# Patient Record
Sex: Male | Born: 1960 | Race: Asian | Hispanic: No | Marital: Married | Smoking: Former smoker
Health system: Southern US, Community
[De-identification: ages and names within clinical notes are randomized; demographics above are authoritative.]

## PROBLEM LIST (undated history)

## (undated) DIAGNOSIS — F329 Major depressive disorder, single episode, unspecified: Secondary | ICD-10-CM

## (undated) DIAGNOSIS — M1990 Unspecified osteoarthritis, unspecified site: Secondary | ICD-10-CM

## (undated) DIAGNOSIS — F32A Depression, unspecified: Secondary | ICD-10-CM

## (undated) DIAGNOSIS — E119 Type 2 diabetes mellitus without complications: Secondary | ICD-10-CM

## (undated) DIAGNOSIS — I10 Essential (primary) hypertension: Secondary | ICD-10-CM

## (undated) HISTORY — PX: ROTATOR CUFF REPAIR: SHX139

## (undated) HISTORY — PX: APPENDECTOMY: SHX54

## (undated) HISTORY — PX: KNEE SURGERY: SHX244

## (undated) HISTORY — PX: SHOULDER SURGERY: SHX246

---

## 2012-06-23 ENCOUNTER — Emergency Department (HOSPITAL_COMMUNITY)
Admission: EM | Admit: 2012-06-23 | Discharge: 2012-06-23 | Disposition: A | Discharge status: Home / Self Care | Payer: Self-pay | Attending: Emergency Medicine | Admitting: Emergency Medicine

## 2012-06-23 ENCOUNTER — Encounter (HOSPITAL_COMMUNITY): Payer: Self-pay | Admitting: Emergency Medicine

## 2012-06-23 DIAGNOSIS — I10 Essential (primary) hypertension: Secondary | ICD-10-CM | POA: Insufficient documentation

## 2012-06-23 DIAGNOSIS — E119 Type 2 diabetes mellitus without complications: Secondary | ICD-10-CM | POA: Insufficient documentation

## 2012-06-23 DIAGNOSIS — M109 Gout, unspecified: Secondary | ICD-10-CM | POA: Insufficient documentation

## 2012-06-23 DIAGNOSIS — Z8659 Personal history of other mental and behavioral disorders: Secondary | ICD-10-CM | POA: Insufficient documentation

## 2012-06-23 DIAGNOSIS — M25473 Effusion, unspecified ankle: Secondary | ICD-10-CM | POA: Insufficient documentation

## 2012-06-23 DIAGNOSIS — Z87891 Personal history of nicotine dependence: Secondary | ICD-10-CM | POA: Insufficient documentation

## 2012-06-23 DIAGNOSIS — M25476 Effusion, unspecified foot: Secondary | ICD-10-CM | POA: Insufficient documentation

## 2012-06-23 HISTORY — DX: Unspecified osteoarthritis, unspecified site: M19.90

## 2012-06-23 HISTORY — DX: Major depressive disorder, single episode, unspecified: F32.9

## 2012-06-23 HISTORY — DX: Type 2 diabetes mellitus without complications: E11.9

## 2012-06-23 HISTORY — DX: Essential (primary) hypertension: I10

## 2012-06-23 HISTORY — DX: Depression, unspecified: F32.A

## 2012-06-23 MED ORDER — PREDNISONE 20 MG PO TABS: 40.0000 mg | ORAL_TABLET | Freq: Every day | ORAL | Status: DC

## 2012-06-23 MED ORDER — COLCHICINE 0.6 MG PO TABS: 0.6000 mg | ORAL_TABLET | Freq: Two times a day (BID) | ORAL | Status: DC

## 2012-06-23 MED ORDER — HYDROCODONE-ACETAMINOPHEN 5-325 MG PO TABS: 1.0000 | ORAL_TABLET | Freq: Four times a day (QID) | ORAL | Status: DC | PRN

## 2012-06-23 MED ORDER — HYDROCODONE-ACETAMINOPHEN 5-325 MG PO TABS
2.0000 | ORAL_TABLET | Freq: Once | ORAL | Status: AC
  Administered 2012-06-23: 2 via ORAL
  Filled 2012-06-23: qty 2

## 2012-06-23 MED ORDER — COLCHICINE 0.6 MG PO TABS
1.2000 mg | ORAL_TABLET | Freq: Once | ORAL | Status: AC
  Administered 2012-06-23: 1.2 mg via ORAL
  Filled 2012-06-23: qty 2

## 2012-06-23 MED ORDER — PREDNISONE 20 MG PO TABS
60.0000 mg | ORAL_TABLET | Freq: Once | ORAL | Status: AC
  Administered 2012-06-23: 60 mg via ORAL
  Filled 2012-06-23: qty 3

## 2012-06-23 MED ORDER — PREDNISONE 20 MG PO TABS: 60.0000 mg | ORAL_TABLET | Freq: Once | ORAL | Status: DC

## 2012-06-23 NOTE — ED Provider Notes (Signed)
History     CSN: [id number]  Arrival date & time 06/23/12  0032   First MD Initiated Contact with Patient 06/23/12 0122      Chief Complaint  Patient presents with  . Foot Pain    (Consider location/radiation/quality/duration/timing/severity/associated sxs/prior treatment) HPI Comments: Patient is a 52 year old male with a history of gout who presents for left foot pain x2 days. Patient states the pain has been gradually worsening since onset, is nonradiating, constant, and sharp in quality. Patient states the pain is worse with weightbearing and with palpation; "it hurts to even lay sheet on my foot". Patient states that pain is consistent with gouty attacks he has had in the past. Patient currently takes 300 mg of allopurinol daily and has tried indomethacin relief. Patient denies fevers, redness or pallor, numbness or tingling, and weakness.  Patient is a 52 y.o. male presenting with lower extremity pain. The history is provided by the patient. No language interpreter was used.  Foot Pain Associated symptoms include arthralgias and joint swelling. Pertinent negatives include no fever.    Past Medical History  Diagnosis Date  . Arthritis     gout  . Diabetes mellitus without complication   . Hypertension   . Depression     Past Surgical History  Procedure Laterality Date  . Rotator cuff repair    . Shoulder surgery    . Appendectomy    . Knee surgery      No family history on file.  History  Substance Use Topics  . Smoking status: Former Games developer  . Smokeless tobacco: Not on file  . Alcohol Use: No     Review of Systems  Constitutional: Negative for fever.  Musculoskeletal: Positive for joint swelling and arthralgias.  Skin: Negative for color change and pallor.  All other systems reviewed and are negative.    Allergies  Review of patient's allergies indicates no known allergies.  Home Medications   Current Outpatient Rx  Name  Route  Sig  Dispense   Refill  . colchicine 0.6 MG tablet   Oral   Take 1 tablet (0.6 mg total) by mouth 2 (two) times daily. For acute gouty attack   8 tablet   0   . HYDROcodone-acetaminophen (NORCO/VICODIN) 5-325 MG per tablet   Oral   Take 1-2 tablets by mouth every 6 (six) hours as needed for pain.   15 tablet   0   . predniSONE (DELTASONE) 20 MG tablet   Oral   Take 2 tablets (40 mg total) by mouth daily.   10 tablet   0     BP 120/77  Pulse 82  Temp(Src) 97.6 F (36.4 C) (Oral)  Resp 17  Ht 5\' 11"  (1.803 m)  Wt 180 lb (81.647 kg)  BMI 25.12 kg/m2  SpO2 96%  Physical Exam  Nursing note and vitals reviewed. Constitutional: He is oriented to person, place, and time. He appears well-developed and well-nourished. No distress.  HENT:  Head: Normocephalic and atraumatic.  Eyes: Conjunctivae and EOM are normal. No scleral icterus.  Neck: Normal range of motion.  Cardiovascular: Normal rate, regular rhythm and normal heart sounds.   Dorsalis pedis and posterior tibial pulses 2+ bilaterally  Pulmonary/Chest: Effort normal and breath sounds normal. No respiratory distress. He has no wheezes. He has no rales.  Musculoskeletal:  Mild swelling and TTP of R ankle and dorsal surface of R foot as well as lateral aspect of 5th MTP joint. Areas of tenderness with slight  heat-to-touch. No decreased ROM, rashes, crepitus, or palpable deformities appreciated.  Neurological: He is alert and oriented to person, place, and time.  Skin: Skin is warm and dry. No rash noted. He is not diaphoretic. No erythema. No pallor.  Psychiatric: He has a normal mood and affect. His behavior is normal.    ED Course  Procedures (including critical care time)  Labs Reviewed - No data to display No results found.   1. Gout flare     MDM  Patient presents for left foot pain x2 days. Symptoms consistent with gout flare and patient endorses that symptoms the same as past gouty attacks. Patient is neurovascularly  intact; there is tenderness to palpation of the left ankle joint as well as the dorsal surface of the left foot and 5th MTP joint with mild swelling and warmth to touch. Symptoms treated with colchicine and Norco in ED; patient also requesting prednisone. Patient hemodynamically stable, well and nontoxic appearing; appropriate for discharge with primary care followup. Indications for ED return provided. Patient states comfort and understanding with this discharge plan with no unaddressed concerns.   Filed Vitals:   06/23/12 0044 06/23/12 0048 06/23/12 0213  BP: 121/85  120/77  Pulse: 92  82  Temp: 97.6 F (36.4 C)    TempSrc: Oral    Resp: 20  17  Height:  5\' 11"  (1.803 m)   Weight:  180 lb (81.647 kg)   SpO2: 95%  96%       Antony Madura, PA-C 06/23/12 0515

## 2012-06-23 NOTE — ED Provider Notes (Signed)
Medical screening examination/treatment/procedure(s) were performed by non-physician practitioner and as supervising physician I was immediately available for consultation/collaboration.  Jones Skene, M.D.     Jones Skene, MD 06/23/12 [phone]

## 2012-06-23 NOTE — ED Notes (Signed)
Pt c/o L foot pain onset yesterday, pt states he feels this is a gout flare. Slight swelling noted to L lateral foot

## 2013-10-29 ENCOUNTER — Ambulatory Visit (INDEPENDENT_AMBULATORY_CARE_PROVIDER_SITE_OTHER): Payer: 59 | Admitting: Family Medicine

## 2013-10-29 VITALS — BP 115/80 | HR 84 | Temp 97.7°F | Resp 16 | Ht 69.75 in | Wt 173.6 lb

## 2013-10-29 DIAGNOSIS — F329 Major depressive disorder, single episode, unspecified: Secondary | ICD-10-CM

## 2013-10-29 DIAGNOSIS — F3289 Other specified depressive episodes: Secondary | ICD-10-CM

## 2013-10-29 DIAGNOSIS — F411 Generalized anxiety disorder: Secondary | ICD-10-CM

## 2013-10-29 DIAGNOSIS — M109 Gout, unspecified: Secondary | ICD-10-CM

## 2013-10-29 DIAGNOSIS — E119 Type 2 diabetes mellitus without complications: Secondary | ICD-10-CM

## 2013-10-29 DIAGNOSIS — F32A Depression, unspecified: Secondary | ICD-10-CM

## 2013-10-29 DIAGNOSIS — G4700 Insomnia, unspecified: Secondary | ICD-10-CM

## 2013-10-29 LAB — POCT URINALYSIS DIPSTICK
Bilirubin, UA: NEGATIVE
Blood, UA: NEGATIVE
Glucose, UA: NEGATIVE
Ketones, UA: NEGATIVE
Leukocytes, UA: NEGATIVE
Nitrite, UA: NEGATIVE
Protein, UA: NEGATIVE
Spec Grav, UA: 1.02
Urobilinogen, UA: 0.2
pH, UA: 5

## 2013-10-29 LAB — COMPLETE METABOLIC PANEL WITH GFR
Albumin: 4.3 g/dL (ref 3.5–5.2)
Alkaline Phosphatase: 102 U/L (ref 39–117)
BUN: 15 mg/dL (ref 6–23)
GFR, Est Non African American: >89 mL/min
Glucose, Bld: 83 mg/dL (ref 70–99)
Total Bilirubin: 0.4 mg/dL (ref 0.2–1.2)

## 2013-10-29 LAB — COMPLETE METABOLIC PANEL WITHOUT GFR
ALT: 13 U/L (ref 0–53)
AST: 18 U/L (ref 0–37)
CO2: 31 meq/L (ref 19–32)
Calcium: 10.1 mg/dL (ref 8.4–10.5)
Chloride: 99 meq/L (ref 96–112)
Creat: 0.93 mg/dL (ref 0.50–1.35)
GFR, Est African American: >89 mL/min
Potassium: 4.9 meq/L (ref 3.5–5.3)
Sodium: 140 meq/L (ref 135–145)
Total Protein: 8 g/dL (ref 6.0–8.3)

## 2013-10-29 LAB — POCT GLYCOSYLATED HEMOGLOBIN (HGB A1C): Hemoglobin A1C: 6.5

## 2013-10-29 LAB — GLUCOSE, POCT (MANUAL RESULT ENTRY): POC Glucose: 71 mg/dL (ref 70–99)

## 2013-10-29 MED ORDER — ALLOPURINOL 100 MG PO TABS: 100.0000 mg | ORAL_TABLET | Freq: Every day | ORAL | Status: DC

## 2013-10-29 MED ORDER — COLCHICINE 0.6 MG PO TABS: 0.6000 mg | ORAL_TABLET | Freq: Two times a day (BID) | ORAL | Status: DC

## 2013-10-29 MED ORDER — CLONAZEPAM 1 MG PO TABS: 1.0000 mg | ORAL_TABLET | Freq: Three times a day (TID) | ORAL | Status: DC | PRN

## 2013-10-29 MED ORDER — ZOLPIDEM TARTRATE 10 MG PO TABS: 10.0000 mg | ORAL_TABLET | Freq: Every evening | ORAL | Status: DC | PRN

## 2013-10-29 MED ORDER — GLIPIZIDE ER 5 MG PO TB24: 5.0000 mg | ORAL_TABLET | Freq: Every day | ORAL | Status: DC

## 2013-10-29 MED ORDER — SERTRALINE HCL 100 MG PO TABS: 100.0000 mg | ORAL_TABLET | Freq: Every day | ORAL | Status: DC

## 2013-10-29 NOTE — Patient Instructions (Signed)
Diabetes and Standards of Medical Care Diabetes is complicated. You may find that your diabetes team includes a dietitian, nurse, diabetes educator, eye doctor, and more. To help everyone know what is going on and to help you get the care you deserve, the following schedule of care was developed to help keep you on track. Below are the tests, exams, vaccines, medicines, education, and plans you will need. HbA1c test This test shows how well you have controlled your glucose over the past 2-3 months. It is used to see if your diabetes management plan needs to be adjusted.   It is performed at least 2 times a year if you are meeting treatment goals.  It is performed 4 times a year if therapy has changed or if you are not meeting treatment goals. Blood pressure test  This test is performed at every routine medical visit. The goal is less than 140/90 mm Hg for most people, but 130/80 mm Hg in some cases. Ask your health care provider about your goal. Dental exam  Follow up with the dentist regularly. Eye exam  If you are diagnosed with type 1 diabetes as a child, get an exam upon reaching the age of 37 years or older and have had diabetes for 3-5 years. Yearly eye exams are recommended after that initial eye exam.  If you are diagnosed with type 1 diabetes as an adult, get an exam within 5 years of diagnosis and then yearly.  If you are diagnosed with type 2 diabetes, get an exam as soon as possible after the diagnosis and then yearly. Foot care exam  Visual foot exams are performed at every routine medical visit. The exams check for cuts, injuries, or other problems with the feet.  A comprehensive foot exam should be done yearly. This includes visual inspection as well as assessing foot pulses and testing for loss of sensation.  Check your feet nightly for cuts, injuries, or other problems with your feet. Tell your health care provider if anything is not healing. Kidney function test (urine  microalbumin)  This test is performed once a year.  Type 1 diabetes: The first test is performed 5 years after diagnosis.  Type 2 diabetes: The first test is performed at the time of diagnosis.  A serum creatinine and estimated glomerular filtration rate (eGFR) test is done once a year to assess the level of chronic kidney disease (CKD), if present. Lipid profile (cholesterol, HDL, LDL, triglycerides)  Performed every 5 years for most people.  The goal for LDL is less than 100 mg/dL. If you are at high risk, the goal is less than 70 mg/dL.  The goal for HDL is 40 mg/dL-50 mg/dL for men and 50 mg/dL-60 mg/dL for women. An HDL cholesterol of 60 mg/dL or higher gives some protection against heart disease.  The goal for triglycerides is less than 150 mg/dL. Influenza vaccine, pneumococcal vaccine, and hepatitis B vaccine  The influenza vaccine is recommended yearly.  It is recommended that people with diabetes who are over 24 years old get the pneumonia vaccine. In some cases, two separate shots may be given. Ask your health care provider if your pneumonia vaccination is up to date.  The hepatitis B vaccine is also recommended for adults with diabetes. Diabetes self-management education  Education is recommended at diagnosis and ongoing as needed. Treatment plan  Your treatment plan is reviewed at every medical visit. Document Released: 12/05/2008 Document Revised: 06/24/2013 Document Reviewed: 07/10/2012 [facility] Patient Information 2015 [place],  [employer]. This information is not intended to replace advice given to you by your health care provider. Make sure you discuss any questions you have with your health care provider.  
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
## 2013-10-30 ENCOUNTER — Encounter: Payer: Self-pay | Admitting: Family Medicine

## 2013-11-24 ENCOUNTER — Emergency Department (HOSPITAL_COMMUNITY): Payer: Self-pay

## 2013-11-24 ENCOUNTER — Encounter (HOSPITAL_COMMUNITY): Payer: Self-pay | Admitting: Emergency Medicine

## 2013-11-24 ENCOUNTER — Emergency Department (HOSPITAL_COMMUNITY)
Admission: EM | Admit: 2013-11-24 | Discharge: 2013-11-24 | Disposition: A | Discharge status: Home / Self Care | Payer: Self-pay | Attending: Emergency Medicine | Admitting: Emergency Medicine

## 2013-11-24 DIAGNOSIS — Y9389 Activity, other specified: Secondary | ICD-10-CM | POA: Insufficient documentation

## 2013-11-24 DIAGNOSIS — M1000 Idiopathic gout, unspecified site: Secondary | ICD-10-CM | POA: Insufficient documentation

## 2013-11-24 DIAGNOSIS — E119 Type 2 diabetes mellitus without complications: Secondary | ICD-10-CM | POA: Insufficient documentation

## 2013-11-24 DIAGNOSIS — Z79899 Other long term (current) drug therapy: Secondary | ICD-10-CM | POA: Insufficient documentation

## 2013-11-24 DIAGNOSIS — S5012XA Contusion of left forearm, initial encounter: Secondary | ICD-10-CM | POA: Insufficient documentation

## 2013-11-24 DIAGNOSIS — S80212A Abrasion, left knee, initial encounter: Secondary | ICD-10-CM | POA: Insufficient documentation

## 2013-11-24 DIAGNOSIS — S50812A Abrasion of left forearm, initial encounter: Secondary | ICD-10-CM | POA: Insufficient documentation

## 2013-11-24 DIAGNOSIS — Z87891 Personal history of nicotine dependence: Secondary | ICD-10-CM | POA: Insufficient documentation

## 2013-11-24 DIAGNOSIS — F329 Major depressive disorder, single episode, unspecified: Secondary | ICD-10-CM | POA: Insufficient documentation

## 2013-11-24 DIAGNOSIS — I10 Essential (primary) hypertension: Secondary | ICD-10-CM | POA: Insufficient documentation

## 2013-11-24 DIAGNOSIS — Y92410 Unspecified street and highway as the place of occurrence of the external cause: Secondary | ICD-10-CM | POA: Insufficient documentation

## 2013-11-24 MED ORDER — NAPROXEN 500 MG PO TABS: 500.0000 mg | ORAL_TABLET | Freq: Two times a day (BID) | ORAL | Status: DC

## 2013-11-24 MED ORDER — TRAMADOL HCL 50 MG PO TABS: 50.0000 mg | ORAL_TABLET | Freq: Four times a day (QID) | ORAL | Status: DC | PRN

## 2013-11-24 MED ORDER — ONDANSETRON 4 MG PO TBDP
4.0000 mg | ORAL_TABLET | Freq: Once | ORAL | Status: AC
  Administered 2013-11-24: 4 mg via ORAL
  Filled 2013-11-24: qty 1

## 2013-11-24 MED ORDER — OXYCODONE-ACETAMINOPHEN 5-325 MG PO TABS
2.0000 | ORAL_TABLET | Freq: Once | ORAL | Status: AC
  Administered 2013-11-24: 2 via ORAL
  Filled 2013-11-24: qty 2

## 2013-11-24 NOTE — ED Notes (Signed)
c-collar removed per MD request
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
## 2013-11-24 NOTE — ED Notes (Signed)
CBG 100 

## 2013-11-27 NOTE — ED Provider Notes (Signed)
Medical screening examination/treatment/procedure(s) were performed by non-physician practitioner and as supervising physician I was immediately available for consultation/collaboration.   Warnell Foresterrey Havana Baldwin, MD 11/27/13 [phone]

## 2014-09-14 ENCOUNTER — Emergency Department: Payer: Self-pay

## 2014-09-14 ENCOUNTER — Encounter: Payer: Self-pay | Admitting: *Deleted

## 2014-09-14 ENCOUNTER — Other Ambulatory Visit: Payer: Self-pay

## 2014-09-14 ENCOUNTER — Inpatient Hospital Stay
Admission: EM | Admit: 2014-09-14 | Discharge: 2014-09-15 | DRG: 916 | Disposition: A | Discharge status: Home / Self Care | Payer: Self-pay | Attending: Specialist | Admitting: Specialist

## 2014-09-14 DIAGNOSIS — Z9049 Acquired absence of other specified parts of digestive tract: Secondary | ICD-10-CM | POA: Diagnosis present

## 2014-09-14 DIAGNOSIS — F1121 Opioid dependence, in remission: Secondary | ICD-10-CM | POA: Diagnosis present

## 2014-09-14 DIAGNOSIS — F329 Major depressive disorder, single episode, unspecified: Secondary | ICD-10-CM | POA: Diagnosis present

## 2014-09-14 DIAGNOSIS — E876 Hypokalemia: Secondary | ICD-10-CM | POA: Diagnosis present

## 2014-09-14 DIAGNOSIS — M109 Gout, unspecified: Secondary | ICD-10-CM | POA: Diagnosis present

## 2014-09-14 DIAGNOSIS — E119 Type 2 diabetes mellitus without complications: Secondary | ICD-10-CM | POA: Diagnosis present

## 2014-09-14 DIAGNOSIS — Z809 Family history of malignant neoplasm, unspecified: Secondary | ICD-10-CM

## 2014-09-14 DIAGNOSIS — T783XXA Angioneurotic edema, initial encounter: Secondary | ICD-10-CM | POA: Diagnosis present

## 2014-09-14 DIAGNOSIS — I10 Essential (primary) hypertension: Secondary | ICD-10-CM | POA: Diagnosis present

## 2014-09-14 DIAGNOSIS — Z833 Family history of diabetes mellitus: Secondary | ICD-10-CM

## 2014-09-14 DIAGNOSIS — T782XXA Anaphylactic shock, unspecified, initial encounter: Principal | ICD-10-CM | POA: Diagnosis present

## 2014-09-14 DIAGNOSIS — Z9889 Other specified postprocedural states: Secondary | ICD-10-CM

## 2014-09-14 DIAGNOSIS — M1990 Unspecified osteoarthritis, unspecified site: Secondary | ICD-10-CM | POA: Diagnosis present

## 2014-09-14 DIAGNOSIS — Z79899 Other long term (current) drug therapy: Secondary | ICD-10-CM

## 2014-09-14 DIAGNOSIS — Z87891 Personal history of nicotine dependence: Secondary | ICD-10-CM

## 2014-09-14 LAB — TROPONIN I

## 2014-09-14 LAB — CBC WITH DIFFERENTIAL/PLATELET
BASOS ABS: 0 10*3/uL (ref 0–0.1)
Basophils Relative: 0 %
Eosinophils Absolute: 0 10*3/uL (ref 0–0.7)
Eosinophils Relative: 0 %
HCT: 38.7 % — ABNORMAL LOW (ref 40.0–52.0)
Hemoglobin: 12.6 g/dL — ABNORMAL LOW (ref 13.0–18.0)
LYMPHS PCT: 3 %
Lymphs Abs: 0.5 10*3/uL — ABNORMAL LOW (ref 1.0–3.6)
MCH: 29.8 pg (ref 26.0–34.0)
MCHC: 32.5 g/dL (ref 32.0–36.0)
MCV: 91.7 fL (ref 80.0–100.0)
MONO ABS: 0.2 10*3/uL (ref 0.2–1.0)
Monocytes Relative: 1 %
Neutro Abs: 13.1 10*3/uL — ABNORMAL HIGH (ref 1.4–6.5)
Neutrophils Relative %: 96 %
Platelets: 269 10*3/uL (ref 150–440)
RBC: 4.22 MIL/uL — ABNORMAL LOW (ref 4.40–5.90)
RDW: 13.3 % (ref 11.5–14.5)
WBC: 13.7 10*3/uL — AB (ref 3.8–10.6)

## 2014-09-14 LAB — GLUCOSE, CAPILLARY: GLUCOSE-CAPILLARY: 225 mg/dL — AB (ref 65–99)

## 2014-09-14 LAB — BASIC METABOLIC PANEL
Anion gap: 5 (ref 5–15)
BUN: 22 mg/dL — AB (ref 6–20)
CHLORIDE: 115 mmol/L — AB (ref 101–111)
CO2: 18 mmol/L — ABNORMAL LOW (ref 22–32)
CREATININE: 1.06 mg/dL (ref 0.61–1.24)
Calcium: 7.8 mg/dL — ABNORMAL LOW (ref 8.9–10.3)
GFR calc Af Amer: >60 mL/min (ref >60)
Glucose, Bld: 331 mg/dL — ABNORMAL HIGH (ref 65–99)
Potassium: 3 mmol/L — ABNORMAL LOW (ref 3.5–5.1)
Sodium: 138 mmol/L (ref 135–145)

## 2014-09-14 LAB — MRSA PCR SCREENING: MRSA by PCR: NEGATIVE

## 2014-09-14 LAB — MAGNESIUM: Magnesium: 1.8 mg/dL (ref 1.7–2.4)

## 2014-09-14 MED ORDER — EPINEPHRINE HCL 0.1 MG/ML IJ SOSY
PREFILLED_SYRINGE | INTRAMUSCULAR | Status: AC
Start: 2014-09-14 — End: 2014-09-14
  Filled 2014-09-14: qty 10

## 2014-09-14 MED ORDER — DIPHENHYDRAMINE HCL 50 MG/ML IJ SOLN
50.0000 mg | Freq: Once | INTRAMUSCULAR | Status: AC
  Administered 2014-09-14: 50 mg via INTRAVENOUS

## 2014-09-14 MED ORDER — ZOLPIDEM TARTRATE 5 MG PO TABS: 10.0000 mg | ORAL_TABLET | Freq: Every evening | ORAL | Status: DC | PRN

## 2014-09-14 MED ORDER — EPINEPHRINE 0.3 MG/0.3ML IJ SOAJ: 0.3000 mg | Freq: Once | INTRAMUSCULAR | Status: DC

## 2014-09-14 MED ORDER — METHYLPREDNISOLONE SODIUM SUCC 125 MG IJ SOLR
125.0000 mg | Freq: Once | INTRAMUSCULAR | Status: AC
  Administered 2014-09-14: 125 mg via INTRAVENOUS

## 2014-09-14 MED ORDER — SODIUM CHLORIDE 0.9 % IV SOLN
Freq: Once | INTRAVENOUS | Status: AC
  Administered 2014-09-14: 07:00:00 via INTRAVENOUS

## 2014-09-14 MED ORDER — COLCHICINE 0.6 MG PO TABS: 0.6000 mg | ORAL_TABLET | Freq: Two times a day (BID) | ORAL | Status: DC | PRN

## 2014-09-14 MED ORDER — EPINEPHRINE HCL 1 MG/ML IJ SOLN
0.3000 mg | Freq: Once | INTRAMUSCULAR | Status: AC
  Administered 2014-09-14: 0.3 mg via INTRAMUSCULAR

## 2014-09-14 MED ORDER — ONDANSETRON HCL 4 MG/2ML IJ SOLN: 4.0000 mg | Freq: Four times a day (QID) | INTRAMUSCULAR | Status: DC | PRN

## 2014-09-14 MED ORDER — INSULIN ASPART 100 UNIT/ML ~~LOC~~ SOLN
0.0000 [IU] | Freq: Every day | SUBCUTANEOUS | Status: DC
  Administered 2014-09-14: 2 [IU] via SUBCUTANEOUS
  Filled 2014-09-14: qty 5

## 2014-09-14 MED ORDER — SODIUM CHLORIDE 0.9 % IV SOLN
Freq: Once | INTRAVENOUS | Status: AC
  Administered 2014-09-14: 06:00:00 via INTRAVENOUS

## 2014-09-14 MED ORDER — ACETAMINOPHEN 325 MG PO TABS: 650.0000 mg | ORAL_TABLET | Freq: Four times a day (QID) | ORAL | Status: DC | PRN

## 2014-09-14 MED ORDER — CLONAZEPAM 1 MG PO TABS: 1.0000 mg | ORAL_TABLET | Freq: Three times a day (TID) | ORAL | Status: DC | PRN

## 2014-09-14 MED ORDER — BUPRENORPHINE HCL 2 MG SL SUBL
2.0000 mg | SUBLINGUAL_TABLET | Freq: Two times a day (BID) | SUBLINGUAL | Status: DC
  Filled 2014-09-14: qty 1

## 2014-09-14 MED ORDER — GLIPIZIDE ER 5 MG PO TB24
5.0000 mg | ORAL_TABLET | Freq: Every day | ORAL | Status: DC
  Administered 2014-09-15: 5 mg via ORAL
  Filled 2014-09-14: qty 1

## 2014-09-14 MED ORDER — SERTRALINE HCL 50 MG PO TABS
100.0000 mg | ORAL_TABLET | Freq: Every day | ORAL | Status: DC
  Administered 2014-09-14 – 2014-09-15 (×2): 100 mg via ORAL
  Filled 2014-09-14 (×2): qty 2

## 2014-09-14 MED ORDER — EPINEPHRINE HCL 1 MG/ML IJ SOLN
INTRAMUSCULAR | Status: AC
  Administered 2014-09-14: 0.3 mg via INTRAMUSCULAR
  Filled 2014-09-14: qty 1

## 2014-09-14 MED ORDER — POTASSIUM CHLORIDE CRYS ER 20 MEQ PO TBCR
20.0000 meq | EXTENDED_RELEASE_TABLET | Freq: Two times a day (BID) | ORAL | Status: DC
  Administered 2014-09-14 – 2014-09-15 (×3): 20 meq via ORAL
  Filled 2014-09-14 (×3): qty 1

## 2014-09-14 MED ORDER — TRAMADOL HCL 50 MG PO TABS
50.0000 mg | ORAL_TABLET | Freq: Four times a day (QID) | ORAL | Status: DC | PRN
Start: 2014-09-14 — End: 2014-09-15

## 2014-09-14 MED ORDER — METHYLPREDNISOLONE SODIUM SUCC 40 MG IJ SOLR
40.0000 mg | Freq: Four times a day (QID) | INTRAMUSCULAR | Status: DC
  Administered 2014-09-14 – 2014-09-15 (×5): 40 mg via INTRAVENOUS
  Filled 2014-09-14 (×5): qty 1

## 2014-09-14 MED ORDER — FAMOTIDINE IN NACL 20-0.9 MG/50ML-% IV SOLN
20.0000 mg | Freq: Two times a day (BID) | INTRAVENOUS | Status: DC
  Administered 2014-09-14 – 2014-09-15 (×3): 20 mg via INTRAVENOUS
  Filled 2014-09-14 (×5): qty 50

## 2014-09-14 MED ORDER — ACETAMINOPHEN 650 MG RE SUPP: 650.0000 mg | Freq: Four times a day (QID) | RECTAL | Status: DC | PRN

## 2014-09-14 MED ORDER — BUTALBITAL-APAP-CAFFEINE 50-325-40 MG PO TABS: 1.0000 | ORAL_TABLET | Freq: Four times a day (QID) | ORAL | Status: DC | PRN

## 2014-09-14 MED ORDER — DIPHENHYDRAMINE HCL 50 MG/ML IJ SOLN
25.0000 mg | Freq: Four times a day (QID) | INTRAMUSCULAR | Status: DC | PRN
  Filled 2014-09-14: qty 1

## 2014-09-14 MED ORDER — INSULIN ASPART 100 UNIT/ML ~~LOC~~ SOLN
0.0000 [IU] | Freq: Three times a day (TID) | SUBCUTANEOUS | Status: DC
  Administered 2014-09-15: 5 [IU] via SUBCUTANEOUS
  Administered 2014-09-15: 3 [IU] via SUBCUTANEOUS
  Filled 2014-09-14: qty 3
  Filled 2014-09-14: qty 5

## 2014-09-14 MED ORDER — ONDANSETRON HCL 4 MG PO TABS: 4.0000 mg | ORAL_TABLET | Freq: Four times a day (QID) | ORAL | Status: DC | PRN

## 2014-09-14 MED ORDER — DIPHENHYDRAMINE HCL 50 MG/ML IJ SOLN
25.0000 mg | Freq: Once | INTRAMUSCULAR | Status: DC
  Filled 2014-09-14: qty 1

## 2014-09-14 MED ORDER — PREDNISONE 20 MG PO TABS: 40.0000 mg | ORAL_TABLET | Freq: Every day | ORAL | Status: DC

## 2014-09-14 MED ORDER — ALLOPURINOL 100 MG PO TABS: 100.0000 mg | ORAL_TABLET | Freq: Every day | ORAL | Status: DC | PRN

## 2014-09-14 NOTE — ED Notes (Signed)
Pt sleeping. Easily awakened. Dr. Elenore Rota remains at bedside. Pt's tongue continues to decrease in size. Speech more clear. Rash continues to improve.

## 2014-09-14 NOTE — Consult Note (Signed)
Dennis Pineda, Dennis Pineda [id number] November 16, 1960 No att. providers found  Reason for Consult: Acute anaphylaxis  HPI: The patient is a 54 year old white male who had a normal day yesterday he went to bed about 10:00 after having had an [employer] meal at around 9 PM. He was feeling well when he went to bed. Woke about 4 AM in the morning and felt like he was having some itching on his left forearm or possibly a bite of some sort. It had some poison ivy on that arm over the last couple weeks it has been treated. He put some poison ivy spray on his arm that is used in the past and went back to bed. Approximately 5 AM he awoke with his legs burning and feeling like he was swelling. His tongue was swelling as well and his wife immediately brought him to the emergency room. It took her 20-25 minutes to get him in. By that time he had significant swelling of his tongue. He was given IV Solu-Medrol, EpiPen 2, IV Benadryl and IV H2 blocker. I was called at that time and saw him by about 6:15 AM at that point his tongue swelling was already down and he was starting to talk normally still felt orbital bit of sensation in his left arm but his whole body rash had subsided. We watched him over 30 minutes and it seemed like his swelling continued to decrease any was doing better and felt that he would likely be able to be discharged home in a couple hours to make sure he is doing well.  I was then called back to the hospital approximately 8 AM where the patient had had increasing swelling of his tongue again. He was given 2 more doses at appendectomy and another 50 mg of IV Benadryl.   by 8:30 when I was seeing him he was starting to feel better again. His tongue swelling at settle down. Does not have any rash. oxygenation was good and he was able to swallow his saliva well. There is concerns of continuing potential anaphylaxis flareups and whether his airway is secure not.  Allergies: No Known Allergies  ROS: Review of systems  normal other than 12 systems except per HPI.  PMH:  Past Medical History  Diagnosis Date  . Arthritis     gout  . Diabetes mellitus without complication   . Hypertension   . Depression     FH:  Family History  Problem Relation Age of Onset  . Diabetes Mother   . Cancer Father     Liver  . Diabetes Father   . Diabetes Sister   . Diabetes Maternal Grandmother     SH:  History   Social History  . Marital Status: Married    Spouse Name: N/A  . Number of Children: N/A  . Years of Education: N/A   Occupational History  . Not on file.   Social History Main Topics  . Smoking status: Former Games developer  . Smokeless tobacco: Not on file  . Alcohol Use: No  . Drug Use: No  . Sexual Activity: Not on file   Other Topics Concern  . Not on file   Social History Narrative    PSH:  Past Surgical History  Procedure Laterality Date  . Rotator cuff repair    . Shoulder surgery    . Appendectomy    . Knee surgery      Physical  Exam: Well-developed well-nourished white male, CN 2-12 grossly intact and symmetric. EAC/TMs  normal BL. Oral cavity, lips, gums, ororpharynx normal with no masses or lesions. No obvious swelling of his tongue currently and he can stick it out to the well. Skin warm and dry with no obvious rash or erythema. He does have a couple small tiny bumps on his left forearm secondary to previous poison ivy changes. Nasal cavity without polyps or purulence. External nose and ears without masses or lesions. EOMI, PERRLA. Neck supple with no masses or lesions. He's had no neck swelling throughout this. No lymphadenopathy palpated. Thyroid normal with no masses.   A/P: Acute anaphylaxis and unsure about the specific cause for this. At first I thought it might be what he is putting on his arm and they're going to avoid that. With the secondary flareup again it implies there is still something in his blood stream that is causing the reaction. I had gone through all his  medications with him and his gout and diabetic medications are taken in the morning. He only thing he takes twice a day is the Suboxone. This is a long-acting medication and may still be in the system some and triggering the flareups.  Dr. Maisie Fus is the anesthesiologist and I had him come down and we evaluated the patient together. His tongue is now normal any be easy to intubate currently. He settle down enough that I am hesitant to intubate him and keep him intubated overnight if he does not need this. He could continue to flareup again in another hour or two, but is more likely that he will finally stay settle down at this time. We will watch him in the ICU carefully. After Maisie Fus is in house to get to them rapidly to intubate if necessary. I am on call through the rest of the weekend and can respond rapidly if necessary. I think the most prudent treatment currently is to watch him closely and not intubate at this time. I've discussed this with his wife as well and she understands the pros and cons and is thankful that he does not need to be intubated currently. He definitely should not take any more Suboxone for now. They will have to discuss with his other doctors what medications to use for shoulder pain.  If he does well and does not have any further flareups he should remain on Benadryl 50 mg every 4 hours until all symptoms are resolved. He does not need to return to ENT office currently has we suspect a drug allergy to Suboxone is the most likely source for anaphylaxis. If he has further problems with be happy to see him.   Shaleka Brines H 09/14/2014 9:01 AM
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
## 2014-09-14 NOTE — ED Notes (Signed)
Pt continues to report improvement. Rash to abd decreased significantly.

## 2014-09-14 NOTE — ED Provider Notes (Signed)
[facility] Emergency Department Provider Note  Time seen: 6:25 AM  I have reviewed the triage vital signs and the nursing notes.   HISTORY  Chief Complaint Allergic Reaction    HPI Dennis Pineda is a 54 y.o. male with a past medical history of arthritis, diabetes, hypertension, depression presents the emergency department with difficulty breathing, and unresponsiveness. According to the wife the patient awoke approximately 30 minutes prior to arrival screaming that something was biting him on his arm. Shortly after he began diffusely itching all over his body. His tongue began to swell, so his wife brought him to the emergency department. On the way to the emergency department the patient passed out, and was unresponsive upon arrival here. Patient was brought in emergently directly to room 15. By this time the patient was awake, alert, and able to talk. Patient presents with diffuse hives all over body, and a significantly swollen tongue, difficulty speaking. Patient denies any prior anaphylactic reactions. Denies any known allergens. Denies any new medications. Denies any new foods.     Past Medical History  Diagnosis Date  . Arthritis     gout  . Diabetes mellitus without complication   . Hypertension   . Depression     There are no active problems to display for this patient.   Past Surgical History  Procedure Laterality Date  . Rotator cuff repair    . Shoulder surgery    . Appendectomy    . Knee surgery      Current Outpatient Rx  Name  Route  Sig  Dispense  Refill  . allopurinol (ZYLOPRIM) 100 MG tablet   Oral   Take 100 mg by mouth daily as needed (for gout).         . butalbital-acetaminophen-caffeine (FIORICET) 50-325-40 MG per tablet   Oral   Take 1 tablet by mouth every 6 (six) hours as needed for headache.         . clonazePAM (KLONOPIN) 1 MG tablet   Oral   Take 1 tablet (1 mg total) by mouth 3 (three) times daily as  needed for anxiety.   90 tablet   1   . colchicine 0.6 MG tablet   Oral   Take 0.6 mg by mouth 2 (two) times daily as needed (for acute gout attacks).         Marland Kitchen glipiZIDE (GLUCOTROL XL) 5 MG 24 hr tablet   Oral   Take 1 tablet (5 mg total) by mouth daily with breakfast.   30 tablet   1   . Naphazoline HCl (CLEAR EYES OP)   Both Eyes   Place 2 drops into both eyes daily as needed (for red eyes).         . naproxen (NAPROSYN) 500 MG tablet   Oral   Take 1 tablet (500 mg total) by mouth 2 (two) times daily with a meal.   30 tablet   0   . sertraline (ZOLOFT) 100 MG tablet   Oral   Take 1 tablet (100 mg total) by mouth daily.   30 tablet   1   . SUBOXONE 8-2 MG FILM   Oral   Take 1 Film by mouth 2 (two) times daily.      0     Dispense as written.   . traMADol (ULTRAM) 50 MG tablet   Oral   Take 1 tablet (50 mg total) by mouth every 6 (six) hours as needed.   15  tablet   0   . zolpidem (AMBIEN) 10 MG tablet   Oral   Take 1 tablet (10 mg total) by mouth at bedtime as needed for sleep.   30 tablet   1     Allergies Review of patient's allergies indicates no known allergies.  Family History  Problem Relation Age of Onset  . Diabetes Mother   . Cancer Father     Liver  . Diabetes Father   . Diabetes Sister   . Diabetes Maternal Grandmother     Social History History  Substance Use Topics  . Smoking status: Former Games developer  . Smokeless tobacco: Not on file  . Alcohol Use: No    Review of Systems Constitutional: Negative for fever. Positive for tongue swelling. Difficulty speaking Cardiovascular: Negative for chest pain. Respiratory: Negative for shortness of breath. Gastrointestinal: Negative for abdominal pain negative for vomiting. Skin: Positive for diffuse rash/hives all over body. 10-point ROS otherwise negative.  ____________________________________________   PHYSICAL EXAM:  VITAL SIGNS: ED Triage Vitals  Enc Vitals Group     BP  09/14/14 0608 141/88 mmHg     Pulse Rate 09/14/14 0608 93     Resp 09/14/14 0608 22     Temp 09/14/14 0608 97.4 F (36.3 C)     Temp Source 09/14/14 0608 Oral     SpO2 09/14/14 0608 94 %     Weight 09/14/14 0608 180 lb (81.647 kg)     Height 09/14/14 0608 6' (1.829 m)     Head Cir --      Peak Flow --      Pain Score --      Pain Loc --      Pain Edu? --      Excl. in GC? --     Constitutional: Alert, very swollen tongue with difficulty speaking. Patient following all commands, moderate anxiety/panic Eyes: Normal exam ENT   Mouth/Throat: Significantly swollen tongue, taking up most of the mouth. Cardiovascular: Normal rate, regular rhythm.  Respiratory: Normal respiratory effort without tachypnea nor retractions. Breath sounds are clear Gastrointestinal: Soft and nontender. No distention.   Musculoskeletal: Moves all extremities. Neurologic:  Difficulty speaking due to swollen tongue. No gross deficits. Skin:  Diffuse urticaria over her chest, abdomen, all extremities. Psychiatric: Anxiety, appropriate for situation.  ____________________________________________     INITIAL IMPRESSION / ASSESSMENT AND PLAN / ED COURSE  Pertinent labs & imaging results that were available during my care of the patient were reviewed by me and considered in my medical decision making (see chart for details).  Patient awoke from sleep approximately 30 minutes prior to arrival complaining of a feeling that something bit his left arm. He sprayed technu spray on the area, and within several minutes began diffusely itching all over body and feeling that his tongue was swollen. The wife Edmonia Caprio brought into the emergency department, on the way the patient passed out. Upon arrival to the emergency department the patient was unresponsive, and brought emergently to room 15. Patient with an exam consistent with a significant anaphylactic reaction. Diffuse urticaria, significantly swollen tongue. Patient  immediately given 125 mg IV Solu-Medrol, 50 mg IV Benadryl, 20 mg IV Pepcid, 0.3 mg of epinephrine bilaterally (0.6 mg total) intramuscularly to the upper extremities. I discussed the patient urgently with ENT Dr. Elenore Rota, given the threatened airway. At that time the patient was able to control secretions, and speak although with difficulty.  Patient is improving status post medications. He feels as if  his tongue is decreasing in size at this time. He is Durwin Reges able to speak much more clearly, and his hives have nearly resolved. It is unclear at this time the cause of his anaphylactic reaction whether it was an insect bite during his sleep, or possibly the spray the patient put on his arm (patient states he has been using this for years though). We have advised him to no longer use the spray. We will monitor the patient very closely in the emergency department. Upon discharge the patient will be discharged with steroids and 2 EpiPen's.  Dr. Elenore Rota at the bedside speaking with the patient. Currently patient's airway is much improved, we will hold off on any airway intervention at this time.  ----------------------------------------- 6:59 AM on 09/14/2014 -----------------------------------------  Patient continues to appear well. Tongue swelling continues to resolve. We will continue to monitor for approximately 4-6 hours in the emergency department. Patient care signed out to Dr. Shaune Pollack.   CRITICAL CARE Performed by: Minna Antis   Total critical care time: 45 minutes  Critical care time was exclusive of separately billable procedures and treating other patients.  Critical care was necessary to treat or prevent imminent or life-threatening deterioration.  Critical care was time spent personally by me on the following activities: development of treatment plan with patient and/or surrogate as well as nursing, discussions with consultants, evaluation of patient's response to treatment,  examination of patient, obtaining history from patient or surrogate, ordering and performing treatments and interventions, ordering and review of laboratory studies, ordering and review of radiographic studies, pulse oximetry and re-evaluation of patient's condition.  ____________________________________________   FINAL CLINICAL IMPRESSION(S) / ED DIAGNOSES  Anaphylactic allergic reaction   Minna Antis, MD 09/14/14 [phone]

## 2014-09-14 NOTE — Progress Notes (Signed)
Called dr Letitia Libra about pt sugars no coverage ordered pt on solu medrol orders were given Shara Blazing, RN

## 2014-09-14 NOTE — ED Provider Notes (Signed)
[facility]  I accepted care from Dr. Lenard Lance ____________________________________________    LABS (pertinent positives/negatives)  Troponin less than 0.03 White blood count 13.7 hemoglobin 12.6 Metabolic panel significant for potassium 3.0 chloride 115 CO2 18 glucose 331    ECG: Interpreted By Governor Rooks M.D. 79 bpm. No sinus rhythm. Narrow QRS. Normal axis. Normal ST and T-wave.  ____________________________________________    RADIOLOGY All xrays were viewed by me. Imaging interpreted by radiologist.  Chest x-ray: Negative  ____________________________________________   PROCEDURES  Procedure(s) performed: None  Critical Care performed: CRITICAL CARE Performed by: Governor Rooks   Total critical care time: 45 minutes  Critical care time was exclusive of separately billable procedures and treating other patients.  Critical care was necessary to treat or prevent imminent or life-threatening deterioration.  Critical care was time spent personally by me on the following activities: development of treatment plan with patient and/or surrogate as well as nursing, discussions with consultants, evaluation of patient's response to treatment, examination of patient, obtaining history from patient or surrogate, ordering and performing treatments and interventions, ordering and review of laboratory studies, ordering and review of radiographic studies, pulse oximetry and re-evaluation of patient's condition.   ____________________________________________   INITIAL IMPRESSION / ASSESSMENT AND PLAN / ED COURSE  CONSULTATIONS: Face-to-face with Dr. Irving Shows, ENT Face-to-face consultation with Dr. Maisie Fus of anesthesia Phone consultation with hospitalist for admission  Pertinent labs & imaging results that were available during my care of the patient were reviewed by me and considered in my medical decision making (see chart for details).  Patient had  been stabilized after anaphylaxis by Dr. Lenard Lance.  Plan was to observe for 4-6 hours and possibly discharge home  At around 755 I went to reevaluate the patient, and the nurse was in the room because the patient was reporting that his tongue was swelling again. His tongue is in fact swollen. No hypertension. Intramuscular epinephrine was given, and after 5 minutes there was no improvement and so a second dose of 0.3 mg IM epinephrine was given. I paged ENT to discuss the possibility of fiberoptic intubation in the OR given the angioedema of the tongue.   5 minutes after the second round of epinephrine, there is no worsening, however no real improvement.  I spoke with ENT who will come to the bedside. He also recommend consideration of having anesthesia to the bedside. I did consult with Dr. Maisie Fus of anesthesia who was in the operating room but would come down as soon as possible. I gave the patient additional 50 mg of Benadryl IV. After about 15 minutes he did seem to have some improvement in terms of the tongue swelling. He was sleepy after the Benadryl but arousable.  Dr. Irving Shows and Dr. Maisie Fus arrived to the bedside about the same time. At this point the patient was significantly improved from the tongue edema. Since the patient had improved twice now with epinephrine and other anaphylactic treatment measures, was decided not to do any prophylactic intubation and to admit the patient to the ICU and continue to treat medically for this and flex or correction.  Consultation with hospitalist for admission.  Patient / Family / Caregiver informed of clinical course, medical decision-making process, and agree with plan.   I discussed return precautions, follow-up instructions, and discharged instructions with patient and/or family.  ____________________________________________   FINAL CLINICAL IMPRESSION(S) / ED DIAGNOSES  Final diagnoses:  Anaphylaxis, initial encounter        Governor Rooks, MD  09/17/14 0709 

## 2014-09-14 NOTE — ED Notes (Signed)
Dr. Elenore Rota at bedside to assess pt. Pt continues to rest in bed without distress. Resp even and unlabored.

## 2014-09-14 NOTE — H&P (Signed)
[facility] [place] at [facility]   PATIENT NAME: Dennis Pineda    MR#:  [id number]  DATE OF BIRTH:  01/08/1961  DATE OF ADMISSION:  09/14/2014  PRIMARY CARE PHYSICIAN: None   REQUESTING/REFERRING PHYSICIAN: Dr. Governor Rooks  CHIEF COMPLAINT:   Chief Complaint  Patient presents with  . Allergic Reaction    Pt presents via stretcher from triage with report that pt awoke screaming to wife that something "bit me". Pt became unresponsive in route to hospital. Pt awake but anxious. Difficulty speaking due to swelling to tongue. Rash all over, but more pronounced to trunk. MD at bedside.    HISTORY OF PRESENT ILLNESS:  Dennis Pineda  is a 54 y.o. male with a known history of diabetes, hypertension, depression, osteoarthritis, history of opioid dependence who presented to the hospital due to a allergic reaction/anaphylaxis. Patient says he woke up around 5:30 this morning when he felt something bit him on his left arm. Patient noticed a small rash consistent with poison ivy and he sprayed Tech Nu spray that he has used in the past. Shortly thereafter patient felt increasingly warm and flushed and saw extensive rash all throughout his body. Patient then noticed that his tongue was started and get swollen and he called his wife who then drove into the hospital. Patient was noted to have anaphylaxis and therefore was given IV Solu-Medrol, IV epinephrine, and Benadryl. Patient improved and his tongue swelling went down and his rash improved. 2 hours after continuing syndrome patient had another episode where his tongue got severely swollen and he became short of breath. He was therefore given another round of epinephrine and steroids and has clinically improved again. Hospitalist services were contacted for further treatment and evaluation.  PAST MEDICAL HISTORY:   Past Medical History  Diagnosis Date  . Arthritis     gout  . Diabetes mellitus without complication    . Hypertension   . Depression     PAST SURGICAL HISTORY:   Past Surgical History  Procedure Laterality Date  . Rotator cuff repair    . Shoulder surgery    . Appendectomy    . Knee surgery      SOCIAL HISTORY:   History  Substance Use Topics  . Smoking status: Former Smoker    Types: Cigarettes  . Smokeless tobacco: Not on file  . Alcohol Use: No    FAMILY HISTORY:   Family History  Problem Relation Age of Onset  . Diabetes Mother   . Cancer Father     Liver  . Diabetes Father   . Diabetes Sister   . Diabetes Maternal Grandmother     DRUG ALLERGIES:  No Known Allergies  REVIEW OF SYSTEMS:   ROS  MEDICATIONS AT HOME:   Prior to Admission medications   Medication Sig Start Date End Date Taking? Authorizing Provider  allopurinol (ZYLOPRIM) 100 MG tablet Take 100 mg by mouth daily as needed (for gout).   Yes Historical Provider, MD  butalbital-acetaminophen-caffeine (FIORICET) 50-325-40 MG per tablet Take 1 tablet by mouth every 6 (six) hours as needed for headache.   Yes Historical Provider, MD  clonazePAM (KLONOPIN) 1 MG tablet Take 1 tablet (1 mg total) by mouth 3 (three) times daily as needed for anxiety. 10/29/13  Yes Thao P Le, DO  colchicine 0.6 MG tablet Take 0.6 mg by mouth 2 (two) times daily as needed (for acute gout attacks).   Yes Historical Provider, MD  glipiZIDE (GLUCOTROL XL)  5 MG 24 hr tablet Take 1 tablet (5 mg total) by mouth daily with breakfast. 10/29/13  Yes Thao P Le, DO  Naphazoline HCl (CLEAR EYES OP) Place 2 drops into both eyes daily as needed (for red eyes).   Yes Historical Provider, MD  naproxen (NAPROSYN) 500 MG tablet Take 1 tablet (500 mg total) by mouth 2 (two) times daily with a meal. 11/24/13  Yes Arthor Captain, PA-C  sertraline (ZOLOFT) 100 MG tablet Take 1 tablet (100 mg total) by mouth daily. 10/29/13  Yes Thao P Le, DO  SUBOXONE 8-2 MG FILM Take 1 Film by mouth 2 (two) times daily. 08/09/14  Yes Historical Provider, MD  traMADol  (ULTRAM) 50 MG tablet Take 1 tablet (50 mg total) by mouth every 6 (six) hours as needed. 11/24/13  Yes Arthor Captain, PA-C  zolpidem (AMBIEN) 10 MG tablet Take 1 tablet (10 mg total) by mouth at bedtime as needed for sleep. 10/29/13  Yes Thao P Le, DO  EPINEPHrine (EPIPEN 2-PAK) 0.3 mg/0.3 mL IJ SOAJ injection Inject 0.3 mLs (0.3 mg total) into the muscle once. 09/14/14   Minna Antis, MD  predniSONE (DELTASONE) 20 MG tablet Take 2 tablets (40 mg total) by mouth daily. 09/14/14   Minna Antis, MD      VITAL SIGNS:  Blood pressure 124/82, pulse 78, temperature 97.4 F (36.3 C), temperature source Oral, resp. rate 12, height 6' (1.829 m), weight 81.647 kg (180 lb), SpO2 100 %.  PHYSICAL EXAMINATION:  Physical Exam  GENERAL:  54 y.o.-year-old patient lying in the bed with no acute distress.  EYES: Pupils equal, round, reactive to light and accommodation. No scleral icterus. Extraocular muscles intact.  HEENT: Head atraumatic, normocephalic. Oropharynx and nasopharynx clear. No oropharyngeal erythema, dry oral mucosa. Positive tongue swelling. NECK:  Supple, no jugular venous distention. No thyroid enlargement, no tenderness.  LUNGS: Normal breath sounds bilaterally, no wheezing, rales, rhonchi. No use of accessory muscles of respiration.  CARDIOVASCULAR: S1, S2 RRR. No murmurs, rubs, gallops, clicks.  ABDOMEN: Soft, nontender, nondistended. Bowel sounds present. No organomegaly or mass.  EXTREMITIES: No pedal edema, cyanosis, or clubbing. + 2 pedal & radial pulses b/l.   NEUROLOGIC: Cranial nerves II through XII are intact. No focal Motor or sensory deficits appreciated b/l PSYCHIATRIC: The patient is alert and oriented x 3. Good affect.  SKIN: No obvious rash, lesion, or ulcer.   LABORATORY PANEL:   CBC  Recent Labs Lab 09/14/14 0904  WBC 13.7*  HGB 12.6*  HCT 38.7*  PLT 269    ------------------------------------------------------------------------------------------------------------------  Chemistries   Recent Labs Lab 09/14/14 0904  NA 138  K 3.0*  CL 115*  CO2 18*  GLUCOSE 331*  BUN 22*  CREATININE 1.06  CALCIUM 7.8*   ------------------------------------------------------------------------------------------------------------------  Cardiac Enzymes  Recent Labs Lab 09/14/14 0904  TROPONINI <0.03   ------------------------------------------------------------------------------------------------------------------  RADIOLOGY:  Dg Chest Port 1 View  09/14/2014   CLINICAL DATA:  Angioedema today.  Initial encounter.  EXAM: PORTABLE CHEST - 1 VIEW  COMPARISON:  PA and lateral chest 11/24/2013.  FINDINGS: The lungs are clear. Heart size is normal. There is no pneumothorax or pleural effusion. No focal bony abnormality is identified.  IMPRESSION: Negative chest.   Electronically Signed   By: Drusilla Kanner M.D.   On: 09/14/2014 09:13     IMPRESSION AND PLAN:   54 year old male with past mental history of hypertension, diabetes, history of opioid dependence, osteoarthritis, history of gout, who presented to the hospital due  to tongue swelling and diffuse rash suspected to be secondary to an allergic reaction/anaphylaxis.  #1 anaphylaxis/allergic reaction-the exact source of the allergic reaction is unclear. It's unlikely to be the spray the patient use for his poison ivy rash as he has used that in the past with no previous reaction to it. -This possibly could be related to an insect bite. Patient has significant improved with epinephrine, IV steroids. We'll admit him to the stepdown unit. -Continue IV steroids, Pepcid, Benadryl as needed. -Patient has been seen by ENT and they do not recommend urgent intervention presently.  #2 history of opioid dependence-continue Suboxone.  #3 diabetes-continue glipizide.  #4 history of gout-no acute attack.  Continue allopurinol.  #5 depression-continue Zoloft.  #6 hypokalemia-we'll place on a potassium supplements and repeat level in the morning. Check magnesium level.    All the records are reviewed and case discussed with ED provider. Management plans discussed with the patient, family and they are in agreement.  CODE STATUS: Full  TOTAL TIME TAKING CARE OF THIS PATIENT: 50 minutes.    Houston Siren M.D on 09/14/2014 at 10:06 AM  Between 7am to 6pm - Pager - [phone]  After 6pm go to [email or web address] - password [email or web address] [facility]  [facility]  Office  [phone]  CC: Primary care physician; No PCP Per Patient

## 2014-09-14 NOTE — Care Management Note (Signed)
Case Management Note  Patient Details  Name: Samie Reasons MRN: [id number] Date of Birth: [birth year]  Subjective/Objective:               This 54yo gentleman reported to his wife that he had received a bite of some kind while at home. Admitted 09/14/14 with a swollen tongue and body rash. Currently receiving IV steroids to decrease symptoms, and airway monitoring per his swollen tongue. Self-pay and no PCP. Anticipate discharge home with no home health services. Case management will assist with discharge medications.      Action/Plan:   Expected Discharge Date:  09/15/14               Expected Discharge Plan:     In-House Referral:     Discharge planning Services     Post Acute Care Choice:    Choice offered to:     DME Arranged:    DME Agency:     HH Arranged:    HH Agency:     Status of Service:     Medicare Important Message Given:    Date Medicare IM Given:    Medicare IM give by:    Date Additional Medicare IM Given:    Additional Medicare Important Message give by:     If discussed at Long Length of Stay Meetings, dates discussed:    Additional Comments:  Edword Cu A, RN 09/14/2014, 1:44 PM

## 2014-09-14 NOTE — ED Notes (Signed)
Pt resting in bed. Reports tongue feels less swollen. Pt able to speak a little more clearly. Resp even and unlabored. Rash remains to body.

## 2014-09-14 NOTE — Plan of Care (Signed)
Problem: Phase I Progression Outcomes Goal: Pain controlled with appropriate interventions Outcome: Progressing Noc/o pain. Goal: Initial discharge plan identified Outcome: Progressing Hopes to be discharged home tomorrow Goal: Hemodynamically stable Outcome: Progressing VSS. Minimal tongue edema and periorbital area very mild swelling.  Taking po fluids without difficulty. Only rash visible is on left forearm. He states he has had this for weeks. Suboxone held until Dr Cherlynn Kaiser talks with his med provider for opiod dependence in am. No withdrawal symptoms noted.

## 2014-09-14 NOTE — ED Notes (Signed)
Pt resting in bed. No acute distress noted. Resp even and unlabored.

## 2014-09-15 LAB — CBC
HCT: 38 % — ABNORMAL LOW (ref 40.0–52.0)
HEMOGLOBIN: 13.1 g/dL (ref 13.0–18.0)
MCH: 31.4 pg (ref 26.0–34.0)
MCHC: 34.5 g/dL (ref 32.0–36.0)
MCV: 91 fL (ref 80.0–100.0)
Platelets: 265 10*3/uL (ref 150–440)
RBC: 4.17 MIL/uL — ABNORMAL LOW (ref 4.40–5.90)
RDW: 13.3 % (ref 11.5–14.5)
WBC: 10.3 10*3/uL (ref 3.8–10.6)

## 2014-09-15 LAB — BASIC METABOLIC PANEL
Anion gap: 7 (ref 5–15)
BUN: 13 mg/dL (ref 6–20)
CO2: 22 mmol/L (ref 22–32)
Calcium: 8.5 mg/dL — ABNORMAL LOW (ref 8.9–10.3)
Chloride: 111 mmol/L (ref 101–111)
Creatinine, Ser: 0.84 mg/dL (ref 0.61–1.24)
GFR calc non Af Amer: >60 mL/min (ref >60)
GLUCOSE: 214 mg/dL — AB (ref 65–99)
POTASSIUM: 4.1 mmol/L (ref 3.5–5.1)
Sodium: 140 mmol/L (ref 135–145)

## 2014-09-15 LAB — GLUCOSE, CAPILLARY
Glucose-Capillary: 170 mg/dL — ABNORMAL HIGH (ref 65–99)
Glucose-Capillary: 224 mg/dL — ABNORMAL HIGH (ref 65–99)

## 2014-09-15 MED ORDER — BUPRENORPHINE HCL-NALOXONE HCL 8-2 MG SL FILM
1.0000 | ORAL_FILM | Freq: Two times a day (BID) | SUBLINGUAL | Status: DC
  Administered 2014-09-15: 1 via SUBLINGUAL
  Filled 2014-09-15 (×2): qty 1

## 2014-09-15 MED ORDER — PREDNISONE 50 MG PO TABS: ORAL_TABLET | ORAL | Status: DC

## 2014-09-15 NOTE — Discharge Summary (Signed)
[facility] - [place] [facility]   PATIENT NAME: Dennis Pineda    MR#:  [id number]  DATE OF BIRTH:  05-Dec-1960  DATE OF ADMISSION:  09/14/2014 ADMITTING PHYSICIAN: Houston Siren, MD  DATE OF DISCHARGE: 09/15/2014  PRIMARY CARE PHYSICIAN: No PCP Per Patient    ADMISSION DIAGNOSIS:  Anaphylaxis, initial encounter [T78.2XXA]  DISCHARGE DIAGNOSIS:  Active Problems:   Angioedema   SECONDARY DIAGNOSIS:   Past Medical History  Diagnosis Date  . Arthritis     gout  . Diabetes mellitus without complication   . Hypertension   . Depression     HOSPITAL COURSE:   54 year old male with past mental history of hypertension, diabetes, history of opioid dependence, osteoarthritis, history of gout, who presented to the hospital due to tongue swelling and diffuse rash suspected to be secondary to an allergic reaction/anaphylaxis.  #1 anaphylaxis/allergic reaction-the exact source of the allergic reaction still remains unclear. ?? Related to [employer] spray he used for [employer] or event an insect bite.  As per ENT evaluation they thought this could be related to his Suboxone. He cannot stop the Suboxone given his history of opioid dependence. -Patient was treated aggressively with IV steroids, IV Benadryl, Pepcid and has clinically improved. He has no rash no hemodynamic instability and no evidence of tongue swelling presently. He is tolerating a regular diet well. -His Suboxone was reintroduced while in the hospital and he has not had any further evidence of anaphylaxis and therefore being discharged on oral prednisone taper presently. He would benefit from seeing an allergist as an outpatient and likely having testing done.  #2 history of opioid dependence-continue Suboxone.  #3 diabetes-continue glipizide.  #4 history of gout-no acute attack. Continue allopurinol.  #5 depression-continue Zoloft.  #6 hypokalemia-improved and resolved. Mg. Level normal.     DISCHARGE CONDITIONS:   Stable.   CONSULTS OBTAINED:     DRUG ALLERGIES:  No Known Allergies  DISCHARGE MEDICATIONS:   Current Discharge Medication List    START taking these medications   Details  EPINEPHrine (EPIPEN 2-PAK) 0.3 mg/0.3 mL IJ SOAJ injection Inject 0.3 mLs (0.3 mg total) into the muscle once. Qty: 1 Device, Refills: 2    predniSONE (DELTASONE) 50 MG tablet Label  & dispense according to the schedule below. 5 Pills PO for 1 day then, 4 Pills PO for 1 day, 3 Pills PO for 1 day, 2 Pills PO for 1 day, 1 Pill PO for 1 days then STOP. Qty: 15 tablet, Refills: 0      CONTINUE these medications which have NOT CHANGED   Details  allopurinol (ZYLOPRIM) 100 MG tablet Take 100 mg by mouth daily as needed (for gout).    butalbital-acetaminophen-caffeine (FIORICET) 50-325-40 MG per tablet Take 1 tablet by mouth every 6 (six) hours as needed for headache.    clonazePAM (KLONOPIN) 1 MG tablet Take 1 tablet (1 mg total) by mouth 3 (three) times daily as needed for anxiety. Qty: 90 tablet, Refills: 1    colchicine 0.6 MG tablet Take 0.6 mg by mouth 2 (two) times daily as needed (for acute gout attacks).    glipiZIDE (GLUCOTROL XL) 5 MG 24 hr tablet Take 1 tablet (5 mg total) by mouth daily with breakfast. Qty: 30 tablet, Refills: 1    Naphazoline HCl (CLEAR EYES OP) Place 2 drops into both eyes daily as needed (for red eyes).    naproxen (NAPROSYN) 500 MG tablet Take 1 tablet (500 mg total)  by mouth 2 (two) times daily with a meal. Qty: 30 tablet, Refills: 0    sertraline (ZOLOFT) 100 MG tablet Take 1 tablet (100 mg total) by mouth daily. Qty: 30 tablet, Refills: 1    SUBOXONE 8-2 MG FILM Take 1 Film by mouth 2 (two) times daily. Refills: 0    traMADol (ULTRAM) 50 MG tablet Take 1 tablet (50 mg total) by mouth every 6 (six) hours as needed. Qty: 15 tablet, Refills: 0    zolpidem (AMBIEN) 10 MG tablet Take 1 tablet (10 mg total) by mouth at bedtime as needed for  sleep. Qty: 30 tablet, Refills: 1         DISCHARGE INSTRUCTIONS:   DIET:  Cardiac diet and Diabetic diet  DISCHARGE CONDITION:  Stable  ACTIVITY:  Activity as tolerated  OXYGEN:  Home Oxygen: No.   Oxygen Delivery: room air  DISCHARGE LOCATION:  home   If you experience worsening of your admission symptoms, develop shortness of breath, life threatening emergency, suicidal or homicidal thoughts you must seek medical attention immediately by calling 911 or calling your MD immediately  if symptoms less severe.  You Must read complete instructions/literature along with all the possible adverse reactions/side effects for all the Medicines you take and that have been prescribed to you. Take any new Medicines after you have completely understood and accpet all the possible adverse reactions/side effects.   Please note  You were cared for by a hospitalist during your hospital stay. If you have any questions about your discharge medications or the care you received while you were in the hospital after you are discharged, you can call the unit and asked to speak with the hospitalist on call if the hospitalist that took care of you is not available. Once you are discharged, your primary care physician will handle any further medical issues. Please note that NO REFILLS for any discharge medications will be authorized once you are discharged, as it is imperative that you return to your primary care physician (or establish a relationship with a primary care physician if you do not have one) for your aftercare needs so that they can reassess your need for medications and monitor your lab values.     Today   Tongue swelling resolved, no rash. Hemodynamically stable. Tolerating PO well.   VITAL SIGNS:  Blood pressure 135/85, pulse 72, temperature 98 F (36.7 C), temperature source Oral, resp. rate 16, height 6' (1.829 m), weight 77.7 kg (171 lb 4.8 oz), SpO2 99 %.  I/O:   Intake/Output  Summary (Last 24 hours) at 09/15/14 1243 Last data filed at 09/15/14 1100  Gross per 24 hour  Intake   1330 ml  Output   2000 ml  Net   -670 ml    PHYSICAL EXAMINATION:  GENERAL:  54 y.o.-year-old patient lying in the bed with no acute distress.  EYES: Pupils equal, round, reactive to light and accommodation. No scleral icterus. Extraocular muscles intact.  HEENT: Head atraumatic, normocephalic. Oropharynx and nasopharynx clear.  NECK:  Supple, no jugular venous distention. No thyroid enlargement, no tenderness.  LUNGS: Normal breath sounds bilaterally, no wheezing, rales,rhonchi. No use of accessory muscles of respiration.  CARDIOVASCULAR: S1, S2 normal. No murmurs, rubs, or gallops.  ABDOMEN: Soft, non-tender, non-distended. Bowel sounds present. No organomegaly or mass.  EXTREMITIES: No pedal edema, cyanosis, or clubbing.  NEUROLOGIC: Cranial nerves II through XII are intact. No focal motor or sensory defecits b/l.  PSYCHIATRIC: The patient is alert and  oriented x 3. Good affect.  SKIN: No obvious rash, lesion, or ulcer.   DATA REVIEW:   CBC  Recent Labs Lab 09/15/14 0444  WBC 10.3  HGB 13.1  HCT 38.0*  PLT 265    Chemistries   Recent Labs Lab 09/14/14 0904 09/15/14 0444  NA 138 140  K 3.0* 4.1  CL 115* 111  CO2 18* 22  GLUCOSE 331* 214*  BUN 22* 13  CREATININE 1.06 0.84  CALCIUM 7.8* 8.5*  MG 1.8  --     Cardiac Enzymes  Recent Labs Lab 09/14/14 0904  TROPONINI <0.03    Microbiology Results  Results for orders placed or performed during the hospital encounter of 09/14/14  MRSA PCR Screening     Status: None   Collection Time: 09/14/14 10:54 AM  Result Value Ref Range Status   MRSA by PCR NEGATIVE NEGATIVE Final    Comment:        The GeneXpert MRSA Assay (FDA approved for NASAL specimens only), is one component of a comprehensive MRSA colonization surveillance program. It is not intended to diagnose MRSA infection nor to guide or monitor  treatment for MRSA infections.     RADIOLOGY:  Dg Chest Port 1 View  09/14/2014   CLINICAL DATA:  Angioedema today.  Initial encounter.  EXAM: PORTABLE CHEST - 1 VIEW  COMPARISON:  PA and lateral chest 11/24/2013.  FINDINGS: The lungs are clear. Heart size is normal. There is no pneumothorax or pleural effusion. No focal bony abnormality is identified.  IMPRESSION: Negative chest.   Electronically Signed   By: Drusilla Kanner M.D.   On: 09/14/2014 09:13      Management plans discussed with the patient, family and they are in agreement.  CODE STATUS:     Code Status Orders        Start     Ordered   09/14/14 1042  Full code   Continuous     09/14/14 1041      TOTAL TIME TAKING CARE OF THIS PATIENT: 40 minutes.    Houston Siren M.D on 09/15/2014 at 12:43 PM  Between 7am to 6pm - Pager - [phone]  After 6pm go to [email or web address] - password [email or web address] [facility]  [place] [facility]  Office  [phone]  CC: Primary care physician; No PCP Per Patient

## 2014-09-15 NOTE — Plan of Care (Signed)
Problem: Phase II Progression Outcomes Goal: Progress activity as tolerated unless otherwise ordered Outcome: Completed/Met Date Met:  09/15/14 Up in room.  Tolerates all activity well Goal: Discharge plan established Outcome: Completed/Met Date Met:  09/15/14 Discharge home with wife  Comments:  Pt has had no signs or symptoms of allergic reaction.  Subloxone was taken as at home. Pt was monitored for 2 hrs post dose. No allergy noted.  He is sent home with prescription for prednisone.  Pt wife and he plan to use her epi pen which is current in case of an allergic reaction in the future. Pamphlet regarding anaphlyaxis signs, symptoms and treatment was given and discussed. All questions have been answered by Dr Verdell Carmine and myself.

## 2014-09-15 NOTE — Care Management Note (Addendum)
Case Management Note  Patient Details  Name: Dennis Pineda MRN: [id number] Date of Birth: [birth year]  Subjective/Objective:       Tongue edema and body rash are decreased. Solumedrol decreased from q 6 hours to q 12 hours. Per suspicion that [name]'s home medication suboxone may be the source of his allergic reaction, he was off suboxone yesterday. Suboxone will be restarted today at home dose and frequency as soon as his family brings in his home bottle of suboxone. [name] will be monitored closely for airway maintenance. Do not anticipate home health services at this time. At time of discharge CM will provide medication assistance, application to [facility],  and coupons if needed.            Action/Plan:   Expected Discharge Date:  09/15/14               Expected Discharge Plan:     In-House Referral:     Discharge planning Services     Post Acute Care Choice:    Choice offered to:     DME Arranged:    DME Agency:     HH Arranged:    HH Agency:     Status of Service:     Medicare Important Message Given:    Date Medicare IM Given:    Medicare IM give by:    Date Additional Medicare IM Given:    Additional Medicare Important Message give by:     If discussed at Long Length of Stay Meetings, dates discussed:    Additional Comments:  Jakyra Kenealy A, RN 09/15/2014, 11:40 AM

## 2014-09-15 NOTE — Discharge Instructions (Signed)
Anaphylactic Reaction °An anaphylactic reaction is a sudden, severe allergic reaction. It affects the whole body. It can be life threatening. You may need to stay in the hospital.  °HOME CARE °· Wear a medical bracelet or necklace that lists your allergy. °· Carry your allergy kit or medicine shot to treat severe allergic reactions with you. These can save your life. °· Do not drive until medicine from your shot has worn off, unless your doctor says it is okay. °· If you have hives or a rash: °¨ Take medicine as told by your doctor. °¨ You may take over-the-counter antihistamine medicine. °¨ Place cold cloths on your skin. Take baths in cool water. Avoid hot baths and hot showers. °GET HELP RIGHT AWAY IF:  °· Your mouth is puffy (swollen), or you have trouble breathing. °· You start making whistling sounds when you breathe (wheezing). °· You have a tight feeling in your chest or throat. °· You have a rash, hives, puffiness, or itching on your body. °· You throw up (vomit) or have watery poop (diarrhea). °· You feel dizzy or pass out (faint). °· You think you are having an allergic reaction. °· You have new symptoms. °This is an emergency. Use your medicine shot or allergy kit as told. Call your local emergency services (911 in [place] Even if you feel better after the shot, you need to go to the hospital emergency department. °MAKE SURE YOU:  °· Understand these instructions. °· Will watch your condition. °· Will get help right away if you are not doing well or get worse. °Document Released: 07/27/2007 Document Revised: 08/09/2011 Document Reviewed: 05/11/2011 °ExitCare® Patient Information ©2015 [employer]. This information is not intended to replace advice given to you by your health care provider. Make sure you discuss any questions you have with your health care provider. ° °

## 2014-09-15 NOTE — Progress Notes (Signed)
Pt was discharged to home via w/ch

## 2015-05-01 DIAGNOSIS — S2249XA Multiple fractures of ribs, unspecified side, initial encounter for closed fracture: Secondary | ICD-10-CM | POA: Insufficient documentation

## 2015-05-10 ENCOUNTER — Ambulatory Visit (INDEPENDENT_AMBULATORY_CARE_PROVIDER_SITE_OTHER): Payer: Self-pay | Admitting: Internal Medicine

## 2015-05-10 VITALS — BP 133/90 | HR 82 | Temp 97.9°F | Resp 16 | Ht 71.0 in | Wt 183.0 lb

## 2015-05-10 DIAGNOSIS — F329 Major depressive disorder, single episode, unspecified: Secondary | ICD-10-CM

## 2015-05-10 DIAGNOSIS — E119 Type 2 diabetes mellitus without complications: Secondary | ICD-10-CM | POA: Insufficient documentation

## 2015-05-10 DIAGNOSIS — S2232XD Fracture of one rib, left side, subsequent encounter for fracture with routine healing: Secondary | ICD-10-CM

## 2015-05-10 DIAGNOSIS — I10 Essential (primary) hypertension: Secondary | ICD-10-CM | POA: Insufficient documentation

## 2015-05-10 DIAGNOSIS — F32A Depression, unspecified: Secondary | ICD-10-CM

## 2015-05-10 MED ORDER — OXYCODONE HCL 10 MG PO TABS: 10.0000 mg | ORAL_TABLET | Freq: Four times a day (QID) | ORAL | Status: DC | PRN
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
## 2015-05-21 ENCOUNTER — Telehealth: Payer: Self-pay

## 2015-05-21 NOTE — Telephone Encounter (Signed)
Pt does not have cell set up to receive messages.  Pt does no longer work at work number listed. Sent letter that he left his medical records here in the office - will be up front for pick up.

## 2015-06-01 ENCOUNTER — Telehealth: Payer: Self-pay | Admitting: Family Medicine

## 2015-06-01 NOTE — Telephone Encounter (Signed)
Tried to call pt to let him know that he had left a folder here and he can come pick it up its up front in file cabinet no anwser

## 2016-03-29 ENCOUNTER — Encounter: Payer: Self-pay | Admitting: Emergency Medicine

## 2016-03-29 ENCOUNTER — Emergency Department
Admission: EM | Admit: 2016-03-29 | Discharge: 2016-03-29 | Disposition: A | Discharge status: Home / Self Care | Payer: Self-pay | Attending: Emergency Medicine | Admitting: Emergency Medicine

## 2016-03-29 DIAGNOSIS — E119 Type 2 diabetes mellitus without complications: Secondary | ICD-10-CM | POA: Insufficient documentation

## 2016-03-29 DIAGNOSIS — Z7984 Long term (current) use of oral hypoglycemic drugs: Secondary | ICD-10-CM | POA: Insufficient documentation

## 2016-03-29 DIAGNOSIS — Z87891 Personal history of nicotine dependence: Secondary | ICD-10-CM | POA: Insufficient documentation

## 2016-03-29 DIAGNOSIS — T7840XA Allergy, unspecified, initial encounter: Secondary | ICD-10-CM | POA: Insufficient documentation

## 2016-03-29 DIAGNOSIS — I10 Essential (primary) hypertension: Secondary | ICD-10-CM | POA: Insufficient documentation

## 2016-03-29 DIAGNOSIS — Z79899 Other long term (current) drug therapy: Secondary | ICD-10-CM | POA: Insufficient documentation

## 2016-03-29 MED ORDER — PREDNISONE 20 MG PO TABS: 60.0000 mg | ORAL_TABLET | Freq: Every day | ORAL | 0 refills | Status: DC

## 2016-03-29 MED ORDER — EPINEPHRINE 0.3 MG/0.3ML IJ SOAJ: 0.3000 mg | Freq: Once | INTRAMUSCULAR | 0 refills | Status: AC

## 2016-03-29 MED ORDER — METHYLPREDNISOLONE SODIUM SUCC 125 MG IJ SOLR
125.0000 mg | INTRAMUSCULAR | Status: AC
  Administered 2016-03-29: 125 mg via INTRAVENOUS
  Filled 2016-03-29: qty 2

## 2016-03-29 NOTE — Discharge Instructions (Signed)

## 2016-03-29 NOTE — ED Triage Notes (Addendum)
Pt presents to ED with c.o allergic reaction, pt reports ate powdered donuts and 5 min later began to break out in hives and itching. Pt reports had 2 doses of epi pens. Pt reports feeling throat closing and had 2nd dose of epi pen 15 min PTA, states is feeling a little better now. Hives noted. Pt reports taking 2 Benadryl pills prior to taking epi.

## 2016-03-29 NOTE — ED Notes (Signed)
Pt ambulatory, A&O x4, taking PO, states he feels "normal". Pt is no longer scratching everywhere.

## 2016-03-29 NOTE — ED Provider Notes (Signed)
[facility] Emergency Department Provider Note  ____________________________________________   First MD Initiated Contact with Patient 03/29/16 [phone]     (approximate)  I have reviewed the triage vital signs and the nursing notes.   HISTORY  Chief Complaint Allergic Reaction    HPI Dennis Pineda is a 56 y.o. male with a history of anaphylactic reaction requiring ICU admission due to an unknown substance but thought to be possibly laundry detergent who had epi-pens at [facility] presents for evaluation of acute onset allergic reaction.  He reports that he ate some powdered doughnuts and within about 5 minutes without and hives primarily on his arms, upper chest, and neck.  He used an EpiPen almost immediately but continued to feel like his throat was closing and that he was having difficulty breathing.  He used his second EpiPen about 15 minutes before he came to the emergency department.  He states he is feeling better now with no additional shortness of breath.  He has had nausea after using the epinephrine but has not had any vomiting and denies diarrhea.  He does not feel lightheaded or weak.  He took Benadryl 50 mg by mouth prior to arrival.  He does not know what could have been in the donuts that caused the reaction because he eats all sorts of knots regularly and does not have any known food allergies.  He denies recent illness and denies any recent medication changes.  He describes the onset as acute and the intensity as severe.  Past Medical History:  Diagnosis Date  . Arthritis    gout  . Depression   . Diabetes mellitus without complication (HCC)   . Hypertension     Patient Active Problem List   Diagnosis Date Noted  . Diabetes mellitus without complication (HCC) 05/10/2015  . Hypertension 05/10/2015  . Depression 05/10/2015  . Fracture of multiple ribs 05/01/2015  . Angioedema 09/14/2014    Past Surgical History:  Procedure Laterality  Date  . APPENDECTOMY    . KNEE SURGERY    . ROTATOR CUFF REPAIR    . SHOULDER SURGERY      Prior to Admission medications   Medication Sig Start Date End Date Taking? Authorizing Provider  allopurinol (ZYLOPRIM) 100 MG tablet Take 100 mg by mouth daily as needed (for gout).    Historical Provider, MD  butalbital-acetaminophen-caffeine (FIORICET) 50-325-40 MG per tablet Take 1 tablet by mouth every 6 (six) hours as needed for headache. Reported on 05/10/2015    Historical Provider, MD  clonazePAM (KLONOPIN) 1 MG tablet Take 1 tablet (1 mg total) by mouth 3 (three) times daily as needed for anxiety. 10/29/13   Thao P Le, DO  colchicine 0.6 MG tablet Take 0.6 mg by mouth 2 (two) times daily as needed (for acute gout attacks).    Historical Provider, MD  EPINEPHrine (EPIPEN 2-PAK) 0.3 mg/0.3 mL IJ SOAJ injection Inject 0.3 mLs (0.3 mg total) into the muscle once. Take for severe allergic reaction, then come immediately to the Emergency Department or call 911. 03/29/16 03/29/16  Loleta Rose, MD  glipiZIDE (GLUCOTROL XL) 5 MG 24 hr tablet Take 1 tablet (5 mg total) by mouth daily with breakfast. Patient not taking: Reported on 05/10/2015 10/29/13   Thao P Le, DO  Naphazoline HCl (CLEAR EYES OP) Place 2 drops into both eyes daily as needed (for red eyes).    Historical Provider, MD  naproxen (NAPROSYN) 500 MG tablet Take 1 tablet (500 mg total) by  mouth 2 (two) times daily with a meal. 11/24/13   Arthor Captain, PA-C  Oxycodone HCl 10 MG TABS Take 1 tablet (10 mg total) by mouth 4 (four) times daily as needed. For transition back to [facility] 05/10/15   Tonye Pearson, MD  predniSONE (DELTASONE) 20 MG tablet Take 3 tablets (60 mg total) by mouth daily. 03/29/16   Loleta Rose, MD  sertraline (ZOLOFT) 100 MG tablet Take 1 tablet (100 mg total) by mouth daily. 10/29/13   Thao P Le, DO  SUBOXONE 8-2 MG FILM Take 1 Film by mouth 2 (two) times daily. 08/09/14   Historical Provider, MD  traMADol (ULTRAM) 50 MG  tablet Take 1 tablet (50 mg total) by mouth every 6 (six) hours as needed. Patient not taking: Reported on 05/10/2015 11/24/13   Arthor Captain, PA-C  zolpidem (AMBIEN) 10 MG tablet Take 1 tablet (10 mg total) by mouth at bedtime as needed for sleep. 10/29/13   Thao P Le, DO    Allergies Patient has no known allergies.  Family History  Problem Relation Age of Onset  . Diabetes Mother   . Cancer Father     Liver  . Diabetes Father   . Diabetes Sister   . Diabetes Maternal Grandmother     Social History Social History  Substance Use Topics  . Smoking status: Former Smoker    Types: Cigarettes  . Smokeless tobacco: Never Used  . Alcohol use No    Review of Systems Constitutional: No fever/chills Eyes: No visual changes. ENT: No sore throat. Cardiovascular: Denies chest pain. Respiratory: Shortness of breath during the acute reaction Gastrointestinal: No abdominal pain.  Nausea, no vomiting.  No diarrhea.  No constipation. Genitourinary: Negative for dysuria. Musculoskeletal: Negative for back pain. Skin: Acute onset urticarial pruritic rash primarily on his upper extremities, upper torso, and neck Neurological: Negative for headaches, focal weakness or numbness.  10-point ROS otherwise negative.  ____________________________________________   PHYSICAL EXAM:  VITAL SIGNS: ED Triage Vitals [03/29/16 0036]  Enc Vitals Group     BP (!) 159/101     Pulse Rate (!) 115     Resp (!) 24     Temp      Temp src      SpO2 97 %     Weight 175 lb (79.4 kg)     Height 5\' 11"  (1.803 m)     Head Circumference      Peak Flow      Pain Score      Pain Loc      Pain Edu?      Excl. in GC?     Constitutional: Alert and oriented. Well appearing and in no acute distress. Eyes: Conjunctivae are normal. PERRL. EOMI. Head: Atraumatic. Nose: No congestion/rhinnorhea. Mouth/Throat: Mucous membranes are moist.  Oropharynx non-erythematous. Neck: No stridor.  No meningeal signs.     Cardiovascular: Normal rate, regular rhythm. Good peripheral circulation. Grossly normal heart sounds. Respiratory: Normal respiratory effort.  No retractions. Lungs CTAB. Gastrointestinal: Soft and nontender. No distention.  Musculoskeletal: No lower extremity tenderness nor edema. No gross deformities of extremities. Neurologic:  Normal speech and language. No gross focal neurologic deficits are appreciated.  Skin:  Skin is warm, dry and intact.  Erythema of the neck and upper chest but without any discrete lesions.  Several scattered urticarial lesions on his arms. Psychiatric: Mood and affect are normal. Speech and behavior are normal.  ____________________________________________   LABS (all labs ordered are listed,  but only abnormal results are displayed)  Labs Reviewed - No data to display ____________________________________________  EKG  None - EKG not ordered by ED physician ____________________________________________  RADIOLOGY   No results found.  ____________________________________________   PROCEDURES  Procedure(s) performed:   Procedures   Critical Care performed: No ____________________________________________   INITIAL IMPRESSION / ASSESSMENT AND PLAN / ED COURSE  Pertinent labs & imaging results that were available during my care of the patient were reviewed by me and considered in my medical decision making (see chart for details).   Clinical Course as of Mar 29 557  Tue Mar 29, 2016  0123 The patient is alert and oriented and in no acute distress at this time.  He has some residual urticarial lesions on his arms and some mild erythema of his face and neck but is no longer feeling short of breath or any neck tightening.  Given his extensive history of anaphylaxis including ICU admission, however, and the fact that he used 2 epi-pens, I told him to expect that we will keep him for 4-6 hours.  I am ordering Solu-Medrol.  No GI symptoms at this  time.  [CF]  0227 Patient sleeping comfortably, no distress, good vital signs  [CF]  0555 The patient's rash has completely resolved and he feels fine.  He has been stable for about 5 and half hours.  I will go ahead and prepare his discharge paperwork with my usual and customary return precautions.  He understands and agrees with the plan.  [CF]    Clinical Course User Index [CF] Loleta Roseory Ellee Wawrzyniak, MD    ____________________________________________  FINAL CLINICAL IMPRESSION(S) / ED DIAGNOSES  Final diagnoses:  Allergic reaction, initial encounter     MEDICATIONS GIVEN DURING THIS VISIT:  Medications  methylPREDNISolone sodium succinate (SOLU-MEDROL) 125 mg/2 mL injection 125 mg (125 mg Intravenous Given 03/29/16 0136)     NEW OUTPATIENT MEDICATIONS STARTED DURING THIS VISIT:  New Prescriptions   EPINEPHRINE (EPIPEN 2-PAK) 0.3 MG/0.3 ML IJ SOAJ INJECTION    Inject 0.3 mLs (0.3 mg total) into the muscle once. Take for severe allergic reaction, then come immediately to the Emergency Department or call 911.   PREDNISONE (DELTASONE) 20 MG TABLET    Take 3 tablets (60 mg total) by mouth daily.    Modified Medications   No medications on file    Discontinued Medications   EPINEPHRINE (EPIPEN 2-PAK) 0.3 MG/0.3 ML IJ SOAJ INJECTION    Inject 0.3 mLs (0.3 mg total) into the muscle once.   PREDNISONE (DELTASONE) 50 MG TABLET    Label  & dispense according to the schedule below. 5 Pills PO for 1 day then, 4 Pills PO for 1 day, 3 Pills PO for 1 day, 2 Pills PO for 1 day, 1 Pill PO for 1 days then STOP.     Note:  This document was prepared using Dragon voice recognition software and may include unintentional dictation errors.    Loleta Roseory Anae Hams, MD 03/29/16 [phone]

## 2016-11-18 ENCOUNTER — Emergency Department
Admission: EM | Admit: 2016-11-18 | Discharge: 2016-11-18 | Disposition: A | Discharge status: Home / Self Care | Payer: BLUE CROSS/BLUE SHIELD | Attending: Emergency Medicine | Admitting: Emergency Medicine

## 2016-11-18 ENCOUNTER — Encounter: Payer: Self-pay | Admitting: Emergency Medicine

## 2016-11-18 DIAGNOSIS — M10072 Idiopathic gout, left ankle and foot: Secondary | ICD-10-CM | POA: Diagnosis not present

## 2016-11-18 DIAGNOSIS — E119 Type 2 diabetes mellitus without complications: Secondary | ICD-10-CM | POA: Diagnosis not present

## 2016-11-18 DIAGNOSIS — M10062 Idiopathic gout, left knee: Secondary | ICD-10-CM | POA: Diagnosis not present

## 2016-11-18 DIAGNOSIS — Z79899 Other long term (current) drug therapy: Secondary | ICD-10-CM | POA: Diagnosis not present

## 2016-11-18 DIAGNOSIS — I10 Essential (primary) hypertension: Secondary | ICD-10-CM | POA: Insufficient documentation

## 2016-11-18 DIAGNOSIS — M25579 Pain in unspecified ankle and joints of unspecified foot: Secondary | ICD-10-CM | POA: Diagnosis present

## 2016-11-18 MED ORDER — PREDNISONE 20 MG PO TABS
60.0000 mg | ORAL_TABLET | Freq: Once | ORAL | Status: AC
  Administered 2016-11-18: 60 mg via ORAL
  Filled 2016-11-18: qty 3

## 2016-11-18 MED ORDER — COLCHICINE 0.6 MG PO TABS: 0.6000 mg | ORAL_TABLET | Freq: Two times a day (BID) | ORAL | 0 refills | Status: DC

## 2016-11-18 MED ORDER — METHYLPREDNISOLONE 4 MG PO TBPK: ORAL_TABLET | ORAL | 0 refills | Status: DC

## 2016-11-18 MED ORDER — COLCHICINE 0.6 MG PO TABS
1.2000 mg | ORAL_TABLET | Freq: Once | ORAL | Status: AC
  Administered 2016-11-18: 1.2 mg via ORAL
  Filled 2016-11-18: qty 2

## 2016-11-18 NOTE — ED Provider Notes (Signed)
[facility] Emergency Department Provider Note   ____________________________________________   First MD Initiated Contact with Patient 11/18/16 1340     (approximate)  I have reviewed the triage vital signs and the nursing notes.   HISTORY  Chief Complaint Foot Pain and Gout    HPI Dennis Pineda is a 56 y.o. male patient presents complaining of gout flareup for 3 days. 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 Patient stating the past has taken colchicine. Patient also relates that he had a history of type 2 diabetesdiagnosed in 2015. Patient state he was taken off of metformin 2017 has not had elevated glucose since discontinued the medication on the advice of his PCP. Patient rates his pain as 8/10. Patient described a pain as "achy". Patient states did not want pain medication.  Past Medical History:  Diagnosis Date  . Arthritis    gout  . Depression   . Diabetes mellitus without complication (HCC)   . Hypertension     Patient Active Problem List   Diagnosis Date Noted  . Diabetes mellitus without complication (HCC) 05/10/2015  . Hypertension 05/10/2015  . Depression 05/10/2015  . Fracture of multiple ribs 05/01/2015  . Angioedema 09/14/2014    Past Surgical History:  Procedure Laterality Date  . APPENDECTOMY    . KNEE SURGERY    . ROTATOR CUFF REPAIR    . SHOULDER SURGERY      Prior to Admission medications   Medication Sig Start Date End Date Taking? Authorizing Provider  allopurinol (ZYLOPRIM) 100 MG tablet Take 100 mg by mouth daily as needed (for gout).    [provider]  butalbital-acetaminophen-caffeine (FIORICET) 50-325-40 MG per tablet Take 1 tablet by mouth every 6 (six) hours as needed for headache. Reported on 05/10/2015    [provider]  clonazePAM (KLONOPIN) 1 MG tablet Take 1 tablet (1 mg total) by mouth 3 (three) times daily as  needed for anxiety. 10/29/13   Le, Thao P, DO  colchicine (COLCRYS) 0.6 MG tablet Take 1 tablet (0.6 mg total) by mouth 2 (two) times daily. 11/18/16   Joni Reining, PA-C  colchicine 0.6 MG tablet Take 0.6 mg by mouth 2 (two) times daily as needed (for acute gout attacks).    [provider]  glipiZIDE (GLUCOTROL XL) 5 MG 24 hr tablet Take 1 tablet (5 mg total) by mouth daily with breakfast. Patient not taking: Reported on 05/10/2015 10/29/13   Hamilton Capri P, DO  methylPREDNISolone (MEDROL DOSEPAK) 4 MG TBPK tablet Take Tapered dose as directed 11/18/16   Joni Reining, PA-C  Naphazoline HCl (CLEAR EYES OP) Place 2 drops into both eyes daily as needed (for red eyes).    [provider]  naproxen (NAPROSYN) 500 MG tablet Take 1 tablet (500 mg total) by mouth 2 (two) times daily with a meal. 11/24/13   Harris, Abigail, PA-C  Oxycodone HCl 10 MG TABS Take 1 tablet (10 mg total) by mouth 4 (four) times daily as needed. For transition back to [facility] orth[facility] 05/10/15   Tonye Pearson, MD  predniSONE (DELTASONE) 20 MG tablet Take 3 tablets (60 mg total) by mouth daily. 03/29/16   Loleta Rose, MD  sertraline (ZOLOFT) 100 MG tablet Take 1 tablet (100 mg total) by mouth daily. 10/29/13   Le, Thao P, DO  SUBOXONE 8-2 MG FILM Take 1 Film by mouth 2 (two) times daily. 08/09/14  [provider]  traMADol (ULTRAM) 50 MG tablet Take 1 tablet (50 mg total) by mouth every 6 (six) hours as needed. Patient not taking: Reported on 05/10/2015 11/24/13   Arthor Captain, PA-C  zolpidem (AMBIEN) 10 MG tablet Take 1 tablet (10 mg total) by mouth at bedtime as needed for sleep. 10/29/13   Le, Thao P, DO    Allergies Patient has no known allergies.  Family History  Problem Relation Age of Onset  . Diabetes Mother   . Cancer Father        Liver  . Diabetes Father   . Diabetes Sister   . Diabetes Maternal Grandmother     Social History Social History  Substance Use Topics  . Smoking status:  Former Smoker    Types: Cigarettes  . Smokeless tobacco: Never Used  . Alcohol use No    Review of Systems Constitutional: No fever/chills Eyes: No visual changes. ENT: No sore throat. Cardiovascular: Denies chest pain. Respiratory: Denies shortness of breath. Gastrointestinal: No abdominal pain.  No nausea, no vomiting.  No diarrhea.  No constipation. Genitourinary: Negative for dysuria. Musculoskeletal: Negative for back pain. Skin: Negative for rash. Neurological: Negative for headaches, focal weakness or numbness. Psychiatric:Diabetes Endocrine:Hypertension   ____________________________________________   PHYSICAL EXAM:  VITAL SIGNS: ED Triage Vitals [11/18/16 1302]  Enc Vitals Group     BP (!) 130/102     Pulse      Resp 20     Temp 98.5 F (36.9 C)     Temp Source Oral     SpO2 97 %     Weight 185 lb (83.9 kg)     Height  (1.803 m)     Head Circumference      Peak Flow      Pain Score 7     Pain Loc      Pain Edu?      Excl. in GC?     Constitutional: Alert and oriented. Well appearing and in no acute distress. Cardiovascular: Normal rate, regular rhythm. Grossly normal heart sounds.  Good peripheral circulation. Respiratory: Normal respiratory effort.  No retractions. Lungs CTAB. Gastrointestinal: Soft and nontender. No distention. No abdominal bruits. No CVA tenderness. Musculoskeletal: Moderate edema but no erythema to the left knee. Obvious edema and erythema medial aspect of left ankle.  Neurologic:  Normal speech and language. No gross focal neurologic deficits are appreciated. No gait instability. Skin:  Skin is warm, dry and intact. No rash noted. Psychiatric: Mood and affect are normal. Speech and behavior are normal.  ____________________________________________   LABS (all labs ordered are listed, but only abnormal results are displayed)  Labs Reviewed - No data to  display ____________________________________________  EKG   ____________________________________________  RADIOLOGY  No results found.  ____________________________________________   PROCEDURES  Procedure(s) performed: None  Procedures  Critical Care performed: No  ____________________________________________   INITIAL IMPRESSION / ASSESSMENT AND PLAN / ED COURSE  Pertinent labs & imaging results that were available during my care of the patient were reviewed by me and considered in my medical decision making (see chart for details).  Patient present with pain to the left knee and left ankle secondary to gout flareup. Patient given discharge care instructions. Patient [facility] Patient advised take medication as directed. Patient given a work note.      ____________________________________________   FINAL CLINICAL IMPRESSION(S) / ED DIAGNOSES  Final diagnoses:  Acute idiopathic gout  of left knee  Acute idiopathic gout of left ankle      NEW MEDICATIONS STARTED DURING THIS VISIT:  Discharge Medication List as of 11/18/2016  1:48 PM    START taking these medications   Details  !! colchicine (COLCRYS) 0.6 MG tablet Take 1 tablet (0.6 mg total) by mouth 2 (two) times daily., Starting Fri 11/18/2016, Print    methylPREDNISolone (MEDROL DOSEPAK) 4 MG TBPK tablet Take Tapered dose as directed, Print     !! - Potential duplicate medications found. Please discuss with provider.       Note:  This document was prepared using Dragon voice recognition software and may include unintentional dictation errors.    Joni Reining, PA-C 11/18/16 1357    Governor Rooks, MD 12/02/16 [phone]

## 2016-11-18 NOTE — ED Triage Notes (Signed)
Pt reports hx of gout and has a flare up now. Pt reports pain to left foot ankle area. Pt reports OTC meds not helping states he need colchicine. Pt has taken this in the past with relief but has not primary and OTC is not working to relieve it. Pt reports thi flare up is the same as they usually are when he has them.

## 2017-07-18 ENCOUNTER — Other Ambulatory Visit: Payer: Self-pay

## 2017-07-18 ENCOUNTER — Encounter: Payer: Self-pay | Admitting: Emergency Medicine

## 2017-07-18 ENCOUNTER — Emergency Department
Admission: EM | Admit: 2017-07-18 | Discharge: 2017-07-18 | Disposition: A | Discharge status: Home / Self Care | Payer: BLUE CROSS/BLUE SHIELD | Attending: Emergency Medicine | Admitting: Emergency Medicine

## 2017-07-18 DIAGNOSIS — E119 Type 2 diabetes mellitus without complications: Secondary | ICD-10-CM | POA: Diagnosis not present

## 2017-07-18 DIAGNOSIS — I10 Essential (primary) hypertension: Secondary | ICD-10-CM | POA: Insufficient documentation

## 2017-07-18 DIAGNOSIS — Z87891 Personal history of nicotine dependence: Secondary | ICD-10-CM | POA: Diagnosis not present

## 2017-07-18 DIAGNOSIS — M10071 Idiopathic gout, right ankle and foot: Secondary | ICD-10-CM | POA: Insufficient documentation

## 2017-07-18 DIAGNOSIS — Z79899 Other long term (current) drug therapy: Secondary | ICD-10-CM | POA: Insufficient documentation

## 2017-07-18 DIAGNOSIS — M25571 Pain in right ankle and joints of right foot: Secondary | ICD-10-CM | POA: Diagnosis present

## 2017-07-18 MED ORDER — COLCHICINE 0.6 MG PO TABS: ORAL_TABLET | ORAL | 0 refills | Status: DC

## 2017-07-18 MED ORDER — METHYLPREDNISOLONE 4 MG PO TBPK: ORAL_TABLET | ORAL | 0 refills | Status: DC

## 2017-07-18 MED ORDER — ALLOPURINOL 100 MG PO TABS: 100.0000 mg | ORAL_TABLET | Freq: Every day | ORAL | 1 refills | Status: DC

## 2017-07-18 NOTE — ED Provider Notes (Signed)
[facility] Emergency Department Provider Note ____________________________________________  Time seen: 1010  I have reviewed the triage vital signs and the nursing notes.  HISTORY  Chief Complaint  Gout  HPI Dennis Pineda is a 57 y.o. male presents himself to the ED for evaluation of pain to the right ankle. Patient reports pain and swelling to the right ankle for the last few days.  He has a history of gout as well as hypertension and diabetes.  Reports that his last prescription for colchicine was not filled, due to the excessive price per tablet.  He did respond well to the steroid eventually.  He denies any injury, accident, or trauma to the right ankle.  Past Medical History:  Diagnosis Date  . Arthritis    gout  . Depression   . Diabetes mellitus without complication (HCC)   . Hypertension     Patient Active Problem List   Diagnosis Date Noted  . Diabetes mellitus without complication (HCC) 05/10/2015  . Hypertension 05/10/2015  . Depression 05/10/2015  . Fracture of multiple ribs 05/01/2015  . Angioedema 09/14/2014    Past Surgical History:  Procedure Laterality Date  . APPENDECTOMY    . KNEE SURGERY    . ROTATOR CUFF REPAIR    . SHOULDER SURGERY      Prior to Admission medications   Medication Sig Start Date End Date Taking? Authorizing Provider  allopurinol (ZYLOPRIM) 100 MG tablet Take 100 mg by mouth daily as needed (for gout).    [provider]  allopurinol (ZYLOPRIM) 100 MG tablet Take 1 tablet (100 mg total) by mouth daily. 07/18/17 09/16/17  Buryl Bamber, Charlesetta Ivory, PA-C  colchicine 0.6 MG tablet Take 2 tabs PO x 1, then 1 tab PO 1 hour later x 1  Max: 1.8 mg total dose per attack, do not repeat within 3 days. 07/18/17   Karisha Marlin, Charlesetta Ivory, PA-C  methylPREDNISolone (MEDROL DOSEPAK) 4 MG TBPK tablet 6-day taper as dircted 07/18/17   Leimomi Zervas, Charlesetta Ivory, PA-C  Naphazoline HCl (CLEAR EYES OP) Place 2 drops into both  eyes daily as needed (for red eyes).    [provider]  naproxen (NAPROSYN) 500 MG tablet Take 1 tablet (500 mg total) by mouth 2 (two) times daily with a meal. 11/24/13   Harris, Abigail, PA-C  sertraline (ZOLOFT) 100 MG tablet Take 1 tablet (100 mg total) by mouth daily. 10/29/13   Le, Thao P, DO  SUBOXONE 8-2 MG FILM Take 1 Film by mouth 2 (two) times daily. 08/09/14   [provider]    Allergies Patient has no known allergies.  Family History  Problem Relation Age of Onset  . Diabetes Mother   . Cancer Father        Liver  . Diabetes Father   . Diabetes Sister   . Diabetes Maternal Grandmother     Social History Social History   Tobacco Use  . Smoking status: Former Smoker    Types: Cigarettes  . Smokeless tobacco: Never Used  Substance Use Topics  . Alcohol use: No    Alcohol/week: 0.0 oz  . Drug use: No   Review of Systems  Constitutional: Negative for fever. Cardiovascular: Negative for chest pain. Respiratory: Negative for shortness of breath. Gastrointestinal: Negative for abdominal pain, vomiting and diarrhea. Genitourinary: Negative for dysuria. Musculoskeletal: Negative for back pain.  Right ankle pain as above. Skin: Negative for rash. Neurological: Negative for headaches, focal weakness or numbness. ____________________________________________  PHYSICAL  EXAM:  VITAL SIGNS: ED Triage Vitals  Enc Vitals Group     BP      Pulse      Resp      Temp      Temp src      SpO2      Weight      Height      Head Circumference      Peak Flow      Pain Score      Pain Loc      Pain Edu?      Excl. in GC?     Constitutional: Alert and oriented. Well appearing and in no distress. Head: Normocephalic and atraumatic. Cardiovascular: Normal rate, regular rhythm. Normal distal pulses. Respiratory: Normal respiratory effort. No wheezes/rales/rhonchi. Musculoskeletal: left ankle with medial and lateral soft tissue swelling.  Patient is  tender to palpation primarily to the medial joint.  Skin is warm to touch.  Normal ankle range of motion.  Nontender with normal range of motion in all extremities.  Neurologic: Antalgic gait without ataxia. Normal speech and language. No gross focal neurologic deficits are appreciated. Skin:  Skin is warm, dry and intact. No rash noted. ____________________________________________  PROCEDURES  Procedures ____________________________________________  INITIAL IMPRESSION / ASSESSMENT AND PLAN / ED COURSE  ED evaluation of acute right ankle pain.  Patient exam is consistent with an acute gout flare.  He will be discharged with prescription for colchicine, allopurinol, and Medrol Dosepak.  He is also encouraged to select a primary care provider for routine medical care.  He will follow-up as discussed.  I reviewed the patient's prescription history over the last 12 months in the multi-state controlled substances database(s) that includes [place], [place], [place], [place], [place], [place], [place], [place], [place], [place], [place], [place], [place], and [place].  Results were notable for monthly narcotic SL films.  ____________________________________________  FINAL CLINICAL IMPRESSION(S) / ED DIAGNOSES  Final diagnoses:  Acute idiopathic gout of right ankle      Lissa Hoard, PA-C 07/18/17 1042    Sharman Cheek, MD 07/19/17 1521

## 2017-07-18 NOTE — ED Triage Notes (Signed)
Presents with pain and swelling to right ankle  Hx of gout and pain feels the same  Denies any injury

## 2017-07-18 NOTE — Discharge Instructions (Addendum)
Take the prescription meds as directed. Rest with the feet elevated when seated. Select a provider at [facility] for routine medical care.

## 2017-09-13 ENCOUNTER — Other Ambulatory Visit: Payer: Self-pay

## 2017-09-13 ENCOUNTER — Emergency Department: Payer: BLUE CROSS/BLUE SHIELD

## 2017-09-13 ENCOUNTER — Emergency Department
Admission: EM | Admit: 2017-09-13 | Discharge: 2017-09-13 | Disposition: A | Discharge status: Home / Self Care | Payer: BLUE CROSS/BLUE SHIELD | Attending: Emergency Medicine | Admitting: Emergency Medicine

## 2017-09-13 DIAGNOSIS — Z87891 Personal history of nicotine dependence: Secondary | ICD-10-CM | POA: Diagnosis not present

## 2017-09-13 DIAGNOSIS — N50811 Right testicular pain: Secondary | ICD-10-CM | POA: Insufficient documentation

## 2017-09-13 DIAGNOSIS — R1030 Lower abdominal pain, unspecified: Secondary | ICD-10-CM | POA: Diagnosis not present

## 2017-09-13 DIAGNOSIS — E119 Type 2 diabetes mellitus without complications: Secondary | ICD-10-CM | POA: Diagnosis not present

## 2017-09-13 DIAGNOSIS — M545 Low back pain, unspecified: Secondary | ICD-10-CM

## 2017-09-13 DIAGNOSIS — Z79899 Other long term (current) drug therapy: Secondary | ICD-10-CM | POA: Insufficient documentation

## 2017-09-13 DIAGNOSIS — I10 Essential (primary) hypertension: Secondary | ICD-10-CM | POA: Insufficient documentation

## 2017-09-13 DIAGNOSIS — R1031 Right lower quadrant pain: Secondary | ICD-10-CM

## 2017-09-13 DIAGNOSIS — R81 Glycosuria: Secondary | ICD-10-CM | POA: Insufficient documentation

## 2017-09-13 LAB — URINALYSIS, COMPLETE (UACMP) WITH MICROSCOPIC
BACTERIA UA: NONE SEEN
Bilirubin Urine: NEGATIVE
Glucose, UA: >=500 mg/dL — AB
Hgb urine dipstick: NEGATIVE
KETONES UR: 5 mg/dL — AB
LEUKOCYTES UA: NEGATIVE
Nitrite: NEGATIVE
PROTEIN: NEGATIVE mg/dL
Specific Gravity, Urine: 1.036 — ABNORMAL HIGH (ref 1.005–1.030)
Squamous Epithelial / LPF: NONE SEEN (ref 0–5)
pH: 5 (ref 5.0–8.0)

## 2017-09-13 MED ORDER — CYCLOBENZAPRINE HCL 10 MG PO TABS: 10.0000 mg | ORAL_TABLET | Freq: Three times a day (TID) | ORAL | 0 refills | Status: DC | PRN

## 2017-09-13 MED ORDER — MELOXICAM 15 MG PO TABS: 15.0000 mg | ORAL_TABLET | Freq: Every day | ORAL | 0 refills | Status: DC

## 2017-09-13 NOTE — ED Provider Notes (Signed)
[facility] Emergency Department Provider Note ____________________________________________  Time seen: Approximately 3:13 PM  I have reviewed the triage vital signs and the nursing notes.  HISTORY  Chief Complaint Back Pain   HPI Dennis BambergerManuel Pineda is a 57 y.o. male who presents to the emergency department for treatment and evaluation of right lower back pain for the past month. He states while at work, he was lifting a bucket that contained large bolts weighing about 100 lb.when the pain started. No other injury. Pain shoots through the right side of the abdomen and into the right testicle. No testicular swelling per patient.He felt a pop in his right lower back initially. He has soaked in Epson salt bath and has taken tylenol and Aleve without much relief.  Past Medical History:  Diagnosis Date  . Arthritis    gout  . Depression   . Diabetes mellitus without complication (HCC)   . Hypertension     Patient Active Problem List   Diagnosis Date Noted  . Diabetes mellitus without complication (HCC) 05/10/2015  . Hypertension 05/10/2015  . Depression 05/10/2015  . Fracture of multiple ribs 05/01/2015  . Angioedema 09/14/2014    Past Surgical History:  Procedure Laterality Date  . APPENDECTOMY    . KNEE SURGERY    . ROTATOR CUFF REPAIR    . SHOULDER SURGERY      Prior to Admission medications   Medication Sig Start Date End Date Taking? Authorizing Provider  allopurinol (ZYLOPRIM) 100 MG tablet Take 100 mg by mouth daily as needed (for gout).    [provider]  allopurinol (ZYLOPRIM) 100 MG tablet Take 1 tablet (100 mg total) by mouth daily. 07/18/17 09/16/17  Menshew, Charlesetta IvoryJenise V Bacon, PA-C  colchicine 0.6 MG tablet Take 2 tabs PO x 1, then 1 tab PO 1 hour later x 1  Max: 1.8 mg total dose per attack, do not repeat within 3 days. 07/18/17   Menshew, Charlesetta IvoryJenise V Bacon, PA-C  cyclobenzaprine (FLEXERIL) 10 MG tablet Take 1 tablet (10 mg total) by mouth  3 (three) times daily as needed for muscle spasms. 09/13/17   Nat Lowenthal, Rulon Eisenmengerari B, FNP  meloxicam (MOBIC) 15 MG tablet Take 1 tablet (15 mg total) by mouth daily. 09/13/17   Kem Boroughsriplett, Nyella Eckels B, FNP  methylPREDNISolone (MEDROL DOSEPAK) 4 MG TBPK tablet 6-day taper as dircted 07/18/17   Menshew, Charlesetta IvoryJenise V Bacon, PA-C  Naphazoline HCl (CLEAR EYES OP) Place 2 drops into both eyes daily as needed (for red eyes).    [provider]  naproxen (NAPROSYN) 500 MG tablet Take 1 tablet (500 mg total) by mouth 2 (two) times daily with a meal. 11/24/13   Harris, Abigail, PA-C  sertraline (ZOLOFT) 100 MG tablet Take 1 tablet (100 mg total) by mouth daily. 10/29/13   Le, Thao P, DO  SUBOXONE 8-2 MG FILM Take 1 Film by mouth 2 (two) times daily. 08/09/14   [provider]    Allergies Patient has no known allergies.  Family History  Problem Relation Age of Onset  . Diabetes Mother   . Cancer Father        Liver  . Diabetes Father   . Diabetes Sister   . Diabetes Maternal Grandmother     Social History Social History   Tobacco Use  . Smoking status: Former Smoker    Types: Cigarettes  . Smokeless tobacco: Never Used  Substance Use Topics  . Alcohol use: No    Alcohol/week: 0.0 oz  .  Drug use: No    Review of Systems Constitutional: Well appearing. Respiratory: Negative for dyspnea. Cardiovascular: Negative for change in skin temperature or color. Musculoskeletal:   Negative for chronic steroid use   Negative for trauma in the presence of osteoporosis  Negative for age over 61 and trauma.  Negative for constitutional symptoms, or history of cancer  Negative for pain worse at night. Skin: Negative for rash, lesion, or wound.  Genitourinary: Negative for urinary retention. Rectal: Negative for fecal incontinence or new onset constipation/bowel habit changes. Hematological/Immunilogical: Negative for immunosuppression, IV drug use, or fever Neurological: Negative for burning,  tingling, numb, electric, radiating pain.                        Negative for saddle anesthesia.                        Negative for focal neurologic deficit, progressive or disabling symptoms             Negative for saddle anesthesia. ____________________________________________   PHYSICAL EXAM:  VITAL SIGNS: ED Triage Vitals  Enc Vitals Group     BP 09/13/17 1434 (!) 159/91     Pulse Rate 09/13/17 1434 84     Resp 09/13/17 1434 20     Temp 09/13/17 1434 98.3 F (36.8 C)     Temp Source 09/13/17 1434 Oral     SpO2 09/13/17 1434 96 %     Weight 09/13/17 1435 178 lb (80.7 kg)     Height 09/13/17 1435 5\' 11"  (1.803 m)     Head Circumference --      Peak Flow --      Pain Score 09/13/17 1438 8     Pain Loc --      Pain Edu? --      Excl. in GC? --     Constitutional: Alert and oriented. Well appearing and in no acute distress. Eyes: Conjunctivae are clear without discharge or drainage.  Head: Atraumatic. Neck: Full, active range of motion. Respiratory: Respirations even and unlabored. Musculoskeletal: Full ROM of the extremities, back pain triggered by lateral bending and twisting motion to the right side, strength 5/5 of the lower extremities as tested. Neurologic: Reflexes of the lower extremities are 2+.  Negative straight leg raise on the right and left side. Skin: Atraumatic.  Psychiatric: Behavior and affect are normal.  ____________________________________________   LABS (all labs ordered are listed, but only abnormal results are displayed)  Labs Reviewed  URINALYSIS, COMPLETE (UACMP) WITH MICROSCOPIC - Abnormal; Notable for the following components:      Result Value   Color, Urine YELLOW (*)    APPearance CLEAR (*)    Specific Gravity, Urine 1.036 (*)    Glucose, UA >=500 (*)    Ketones, ur 5 (*)    All other components within normal limits   ____________________________________________  RADIOLOGY  Lower lumbar images negative for acute bony  abnormality per radiology.  Ultrasound of the right side groin does not show evidence of an inguinal hernia or other groin abnormality. ____________________________________________   PROCEDURES  Procedure(s) performed:  Procedures ____________________________________________   INITIAL IMPRESSION / ASSESSMENT AND PLAN / ED COURSE  Dennis Pineda is a 57 y.o. male who presents to the emergency department for treatment and evaluation of right sided back pain that he can also feel in his groin and right side testicle.  Images are reassuring.  He will be  treated with Flexeril and meloxicam.  He was also advised that he needs to resume his metformin and check his blood sugar.  He was advised that he does have glucose in his urine and that will need to be followed and managed by his primary care provider.  Patient states that he has not been symptomatic and therefore did not think that not taking the medication was causing any issues since he felt well.  Long term/chronic health issues were discussed in relation to untreated type 2 diabetes.  Patient states that he will schedule follow-up appointment and have a physical with his primary care provider.  For his back and groin pain, he was encouraged to rest, use ice or heat as tolerated, and take the medications as prescribed.  He will return to the emergency department for symptoms of change or worsen if unable to schedule an appointment.  Medications - No data to display  ED Discharge Orders        Ordered    meloxicam (MOBIC) 15 MG tablet  Daily     09/13/17 1730    cyclobenzaprine (FLEXERIL) 10 MG tablet  3 times daily PRN     09/13/17 1730       Pertinent labs & imaging results that were available during my care of the patient were reviewed by me and considered in my medical decision making (see chart for details).  _________________________________________   FINAL CLINICAL IMPRESSION(S) / ED DIAGNOSES  Final diagnoses:  Acute  lumbar back pain  Groin discomfort, right  Glucosuria     If controlled substance prescribed during this visit, 12 month history viewed on the [facility] prior to issuing an initial prescription for Schedule II or III opiod.    Chinita Pester, FNP 09/13/17 2040    Dionne Bucy, MD 09/14/17 0003

## 2017-09-13 NOTE — Discharge Instructions (Signed)
Please follow up with your primary care provider when possible for a physical that should include having your urine rechecked and an A1C drawn.   Return to the ER for symptoms that change or worsen if unable to schedule an appointment.

## 2017-09-13 NOTE — ED Triage Notes (Signed)
Pt states R lower back pain x 1 week. States was lifting something and pain began. Denies falls. States pain shoots through R side of abd and into R testicle. Denies testicular swelling. Alert, oriented, ambulatory.

## 2017-09-18 ENCOUNTER — Observation Stay
Admission: EM | Admit: 2017-09-18 | Discharge: 2017-09-20 | Disposition: A | Discharge status: Home / Self Care | Payer: BLUE CROSS/BLUE SHIELD | Attending: Internal Medicine | Admitting: Internal Medicine

## 2017-09-18 ENCOUNTER — Emergency Department: Payer: BLUE CROSS/BLUE SHIELD

## 2017-09-18 ENCOUNTER — Other Ambulatory Visit: Payer: Self-pay

## 2017-09-18 DIAGNOSIS — M109 Gout, unspecified: Secondary | ICD-10-CM | POA: Diagnosis not present

## 2017-09-18 DIAGNOSIS — Z87891 Personal history of nicotine dependence: Secondary | ICD-10-CM | POA: Diagnosis not present

## 2017-09-18 DIAGNOSIS — T50905A Adverse effect of unspecified drugs, medicaments and biological substances, initial encounter: Secondary | ICD-10-CM | POA: Diagnosis not present

## 2017-09-18 DIAGNOSIS — R55 Syncope and collapse: Secondary | ICD-10-CM | POA: Diagnosis not present

## 2017-09-18 DIAGNOSIS — Z800 Family history of malignant neoplasm of digestive organs: Secondary | ICD-10-CM | POA: Diagnosis not present

## 2017-09-18 DIAGNOSIS — R4182 Altered mental status, unspecified: Principal | ICD-10-CM | POA: Insufficient documentation

## 2017-09-18 DIAGNOSIS — Z23 Encounter for immunization: Secondary | ICD-10-CM | POA: Diagnosis not present

## 2017-09-18 DIAGNOSIS — Z833 Family history of diabetes mellitus: Secondary | ICD-10-CM | POA: Diagnosis not present

## 2017-09-18 DIAGNOSIS — F329 Major depressive disorder, single episode, unspecified: Secondary | ICD-10-CM | POA: Diagnosis not present

## 2017-09-18 DIAGNOSIS — I10 Essential (primary) hypertension: Secondary | ICD-10-CM | POA: Insufficient documentation

## 2017-09-18 DIAGNOSIS — R400 Somnolence: Secondary | ICD-10-CM

## 2017-09-18 DIAGNOSIS — E119 Type 2 diabetes mellitus without complications: Secondary | ICD-10-CM | POA: Insufficient documentation

## 2017-09-18 DIAGNOSIS — Z79899 Other long term (current) drug therapy: Secondary | ICD-10-CM | POA: Diagnosis not present

## 2017-09-18 DIAGNOSIS — R5383 Other fatigue: Secondary | ICD-10-CM | POA: Insufficient documentation

## 2017-09-18 DIAGNOSIS — M1990 Unspecified osteoarthritis, unspecified site: Secondary | ICD-10-CM | POA: Diagnosis not present

## 2017-09-18 DIAGNOSIS — R410 Disorientation, unspecified: Secondary | ICD-10-CM | POA: Diagnosis present

## 2017-09-18 DIAGNOSIS — R4189 Other symptoms and signs involving cognitive functions and awareness: Secondary | ICD-10-CM | POA: Diagnosis present

## 2017-09-18 DIAGNOSIS — F32A Depression, unspecified: Secondary | ICD-10-CM | POA: Diagnosis present

## 2017-09-18 LAB — CBC
HCT: 42.9 % (ref 40.0–52.0)
Hemoglobin: 14.6 g/dL (ref 13.0–18.0)
MCH: 30.3 pg (ref 26.0–34.0)
MCHC: 34.1 g/dL (ref 32.0–36.0)
MCV: 89 fL (ref 80.0–100.0)
PLATELETS: 278 10*3/uL (ref 150–440)
RBC: 4.82 MIL/uL (ref 4.40–5.90)
RDW: 14 % (ref 11.5–14.5)
WBC: 8.4 10*3/uL (ref 3.8–10.6)

## 2017-09-18 LAB — COMPREHENSIVE METABOLIC PANEL
ALT: 21 U/L (ref 0–44)
ANION GAP: 11 (ref 5–15)
AST: 27 U/L (ref 15–41)
Albumin: 4.2 g/dL (ref 3.5–5.0)
Alkaline Phosphatase: 76 U/L (ref 38–126)
BILIRUBIN TOTAL: 0.5 mg/dL (ref 0.3–1.2)
BUN: 37 mg/dL — AB (ref 6–20)
CO2: 24 mmol/L (ref 22–32)
Calcium: 9.1 mg/dL (ref 8.9–10.3)
Chloride: 102 mmol/L (ref 98–111)
Creatinine, Ser: 1.06 mg/dL (ref 0.61–1.24)
GFR calc Af Amer: >60 mL/min (ref >60)
Glucose, Bld: 233 mg/dL — ABNORMAL HIGH (ref 70–99)
POTASSIUM: 4.2 mmol/L (ref 3.5–5.1)
Sodium: 137 mmol/L (ref 135–145)
TOTAL PROTEIN: 7.4 g/dL (ref 6.5–8.1)

## 2017-09-18 LAB — BLOOD GAS, VENOUS
Acid-base deficit: 0.3 mmol/L (ref 0.0–2.0)
Bicarbonate: 26.4 mmol/L (ref 20.0–28.0)
O2 SAT: 85.8 %
PCO2 VEN: 50 mmHg (ref 44.0–60.0)
PH VEN: 7.33 (ref 7.250–7.430)
PO2 VEN: 55 mmHg — AB (ref 32.0–45.0)
Patient temperature: 37

## 2017-09-18 LAB — GLUCOSE, CAPILLARY
GLUCOSE-CAPILLARY: 143 mg/dL — AB (ref 70–99)
Glucose-Capillary: 218 mg/dL — ABNORMAL HIGH (ref 70–99)
Glucose-Capillary: 177 mg/dL — ABNORMAL HIGH (ref 70–99)

## 2017-09-18 LAB — TROPONIN I

## 2017-09-18 LAB — TSH: TSH: 1.918 u[IU]/mL (ref 0.350–4.500)

## 2017-09-18 LAB — ACETAMINOPHEN LEVEL

## 2017-09-18 LAB — SALICYLATE LEVEL: Salicylate Lvl: <7 mg/dL (ref 2.8–30.0)

## 2017-09-18 LAB — AMMONIA: AMMONIA: 35 umol/L (ref 9–35)

## 2017-09-18 LAB — ETHANOL

## 2017-09-18 MED ORDER — NALOXONE HCL 2 MG/2ML IJ SOSY
0.4000 mg | PREFILLED_SYRINGE | Freq: Once | INTRAMUSCULAR | Status: AC
  Administered 2017-09-18: 0.4 mg via INTRAVENOUS
  Filled 2017-09-18: qty 2

## 2017-09-18 MED ORDER — SODIUM CHLORIDE 0.9 % IV BOLUS
1000.0000 mL | Freq: Once | INTRAVENOUS | Status: AC
Start: 2017-09-18 — End: 2017-09-18
  Administered 2017-09-18: 1000 mL via INTRAVENOUS

## 2017-09-18 MED ORDER — SODIUM CHLORIDE 0.9 % IV BOLUS
500.0000 mL | Freq: Once | INTRAVENOUS | Status: AC
  Administered 2017-09-18: 500 mL via INTRAVENOUS

## 2017-09-18 NOTE — ED Notes (Signed)
Narcan administration has no effect on patient drowsiness.

## 2017-09-18 NOTE — ED Notes (Signed)
Patient transported to CT 

## 2017-09-18 NOTE — ED Notes (Signed)
Lab called to add on UDS to urine specimen in lab.

## 2017-09-18 NOTE — ED Notes (Signed)
Pt returned from MRI. Still awaiting urine sample from pt. Family member remains at bedside.

## 2017-09-18 NOTE — ED Notes (Signed)
Delayed responses, shuts eyes unless being engaged.

## 2017-09-18 NOTE — ED Notes (Signed)
Pt returned from MRI °

## 2017-09-18 NOTE — ED Provider Notes (Signed)
[facility] Emergency Department Provider Note   ____________________________________________   First MD Initiated Contact with Patient 09/18/17 1720     (approximate)  I have reviewed the triage vital signs and the nursing notes.   HISTORY  Chief Complaint Dizziness and Altered Mental Status  EM caveat: Patient very somnolent, poor recollection and confusion unable to provide a sustained history  HPI Dennis Pineda is a 57 y.o. male brought by his wife for concerns of confusion and weakness.  Patient here for evaluation, wife reports she went to work and he started calling her asking her why she was not home yet.  When she got home he seemed fatigued, was occasionally acting strangely beginning yesterday afternoon.  Exact time is not certain, she saw him yesterday morning and he seemed okay.  Then throughout the course today he is continued to feel very sleepy and tired, not wanting to get up or do any walking.  He has been confused.  She reports he has had some back pain, was given prescription for muscle relaxant, but she is kept that with her and is not given him any for 24 hours.  She reports she has had symptoms like this where she will just act strangely for a day or 2, "as though he is got dementia" and then within the next day he will start acting normal once again.  Wife reports he does have a history of previous alcohol use, but none current.  Also a history of prior opioid abuse and uses Suboxone, but she reports he has been using that as prescribed.  She reports she has had several episodes like the same today, probably 10 over the last couple of years, and 2 episodes over the last week where he is just acting strangely for a day or 2 and then he seems to "snap out of it" and go back to normal.  He does not complain of anything.  The patient himself opens eyes, is able to give just a slight amount of history that he is tired, he feels fatigued after  working, but is not having any pain.  He is also not able to tell me how he got here though, does not recall events of the last few days.    Past Medical History:  Diagnosis Date  . Arthritis    gout  . Depression   . Diabetes mellitus without complication (HCC)   . Hypertension     Patient Active Problem List   Diagnosis Date Noted  . Diabetes mellitus without complication (HCC) 05/10/2015  . Hypertension 05/10/2015  . Depression 05/10/2015  . Fracture of multiple ribs 05/01/2015  . Angioedema 09/14/2014    Past Surgical History:  Procedure Laterality Date  . APPENDECTOMY    . KNEE SURGERY    . ROTATOR CUFF REPAIR    . SHOULDER SURGERY      Prior to Admission medications   Medication Sig Start Date End Date Taking? Authorizing Provider  allopurinol (ZYLOPRIM) 100 MG tablet Take 100 mg by mouth daily as needed (for gout).    [provider]  allopurinol (ZYLOPRIM) 100 MG tablet Take 1 tablet (100 mg total) by mouth daily. 07/18/17 09/16/17  Menshew, Charlesetta Ivory, PA-C  colchicine 0.6 MG tablet Take 2 tabs PO x 1, then 1 tab PO 1 hour later x 1  Max: 1.8 mg total dose per attack, do not repeat within 3 days. 07/18/17   Menshew, Charlesetta Ivory, PA-C  cyclobenzaprine (  FLEXERIL) 10 MG tablet Take 1 tablet (10 mg total) by mouth 3 (three) times daily as needed for muscle spasms. 09/13/17   Triplett, Rulon Eisenmengerari B, FNP  meloxicam (MOBIC) 15 MG tablet Take 1 tablet (15 mg total) by mouth daily. 09/13/17   Kem Boroughsriplett, Cari B, FNP  methylPREDNISolone (MEDROL DOSEPAK) 4 MG TBPK tablet 6-day taper as dircted 07/18/17   Menshew, Charlesetta IvoryJenise V Bacon, PA-C  Naphazoline HCl (CLEAR EYES OP) Place 2 drops into both eyes daily as needed (for red eyes).    [provider]  naproxen (NAPROSYN) 500 MG tablet Take 1 tablet (500 mg total) by mouth 2 (two) times daily with a meal. 11/24/13   Harris, Abigail, PA-C  sertraline (ZOLOFT) 100 MG tablet Take 1 tablet (100 mg total) by mouth daily. 10/29/13    Le, Thao P, DO  SUBOXONE 8-2 MG FILM Take 1 Film by mouth 2 (two) times daily. 08/09/14   [provider]    Allergies Patient has no known allergies.  Family History  Problem Relation Age of Onset  . Diabetes Mother   . Cancer Father        Liver  . Diabetes Father   . Diabetes Sister   . Diabetes Maternal Grandmother     Social History Social History   Tobacco Use  . Smoking status: Former Smoker    Types: Cigarettes  . Smokeless tobacco: Never Used  Substance Use Topics  . Alcohol use: No    Alcohol/week: 0.0 oz  . Drug use: No    Review of Systems  EM caveat  Wife also reports she has not noticed that he has had any seizures or shaking activity.  He is not had any episodes where he passed out.  He has not urinated on himself or bit his tongue.  He denies any trauma or injury.    ____________________________________________   PHYSICAL EXAM:  VITAL SIGNS: ED Triage Vitals  Enc Vitals Group     BP 09/18/17 1650 109/77     Pulse Rate 09/18/17 1650 82     Resp 09/18/17 1650 20     Temp 09/18/17 1650 97.8 F (36.6 C)     Temp Source 09/18/17 1650 Oral     SpO2 09/18/17 1650 95 %     Weight 09/18/17 1651 178 lb (80.7 kg)     Height 09/18/17 1651 5\' 11"  (1.803 m)     Head Circumference --      Peak Flow --      Pain Score 09/18/17 1700 7     Pain Loc --      Pain Edu? --      Excl. in GC? --     Constitutional: Alert and oriented to self, but disoriented to time.  He is somnolent, but keeps his eyes closed but awakens to verbal but cannot provide a sustained history.  He appears very somnolent. Eyes: Conjunctivae are normal. Head: Atraumatic. Nose: No congestion/rhinnorhea. Mouth/Throat: Mucous membranes are slow and what dry. Neck: No stridor.   Cardiovascular: Normal rate, regular rhythm. Grossly normal heart sounds.  Good peripheral circulation. Respiratory: Normal respiratory effort.  No retractions. Lungs CTAB. Gastrointestinal: Soft and  nontender. No distention. Musculoskeletal: No lower extremity tenderness nor edema. Neurologic: When he does speak the words he has are clear.  He is able to move all extremities to command, but 4-5 in all extremities without focal abnormality.  Is able to smile with a symmetric smile and sticks his tongue out  midline. Skin:  Skin is warm, dry and intact. No rash noted. Psychiatric: Mood and affect are very calm, sedate, but in no distress.  Pleasant.  Not showing any evidence of psychomotor agitation.  ____________________________________________   LABS (all labs ordered are listed, but only abnormal results are displayed)  Labs Reviewed  GLUCOSE, CAPILLARY - Abnormal; Notable for the following components:      Result Value   Glucose-Capillary 218 (*)    All other components within normal limits  COMPREHENSIVE METABOLIC PANEL - Abnormal; Notable for the following components:   Glucose, Bld 233 (*)    BUN 37 (*)    All other components within normal limits  ACETAMINOPHEN LEVEL - Abnormal; Notable for the following components:   Acetaminophen (Tylenol), Serum <10 (*)    All other components within normal limits  BLOOD GAS, VENOUS - Abnormal; Notable for the following components:   pO2, Ven 55.0 (*)    All other components within normal limits  GLUCOSE, CAPILLARY - Abnormal; Notable for the following components:   Glucose-Capillary 177 (*)    All other components within normal limits  CBC  TROPONIN I  SALICYLATE LEVEL  URINALYSIS, COMPLETE (UACMP) WITH MICROSCOPIC  URINE DRUG SCREEN, QUALITATIVE ([facility] ONLY)  AMMONIA  ETHANOL  TSH  CBG MONITORING, ED  CBG MONITORING, ED  CBG MONITORING, ED  CBG MONITORING, ED  CBG MONITORING, ED  CBG MONITORING, ED  CBG MONITORING, ED  CBG MONITORING, ED   ____________________________________________  EKG  Reviewed and interpreted by me at 1655 Heart rate 80 QRS 80 QTc 430 Normal sinus rhythm no evidence of acute  ischemia ____________________________________________  RADIOLOGY    CT the head negative for acute ____________________________________________   PROCEDURES  Procedure(s) performed: None  Procedures  Critical Care performed: No  ____________________________________________   INITIAL IMPRESSION / ASSESSMENT AND PLAN / ED COURSE  Pertinent labs & imaging results that were available during my care of the patient were reviewed by me and considered in my medical decision making (see chart for details).  Presentation of appears to be intermittent episodes of altered mental status according to his wife several of the same episodes over the last 2 years ago, and last for about a day or 2 where he will be confused, lethargic sleepy somnolent and then seems to go back to normal.  There is no evidence of an acute infectious etiology, he has no complaint of any headache no fever normal white count.  Do not see any signs that this would represent encephalitis or meningitis, especially in its recurrent nature.  Would consider potential neurologic etiology including frontal lobe abnormalities, though no obvious reported seizure-like activity and his clinical exam is without focality or any evidence of seizure at this time and he is alert, but slightly disoriented.  Encephalopathy, broad differential including toxic metabolic, potential medication use or abuse, alcohol abuse etc. are all considered.  Patient was given a small dose of naloxone without any change lowering my suspicion for opioid use.  No obvious focal abnormality to suggest stroke or posterior stroke, but have ordered MRI to assure.  Patient's last well was yesterday morning or afternoon, well outside of any acute TPA treatment window, and exam shows no focal abnormality.  ----------------------------------------- 6:54 PM on 09/18/2017 -----------------------------------------  Patient able to hold a set up, alert and appears to be  slowly improving but still somnolent.  Discussed with Dr. Marisa Severin, we will continue to observe the patient for improvement in mental status with hydration  and have ordered and currently pending MRI of the brain, ammonia, TSH, ethanol and chest x-ray.  Anticipate if the patient returns to his normal mental status over observation for the next few hours in the ER he may build to go home with the remaining lab work is reassuring, but if he continues to seem very somnolent would recommend admission for neurology consultation and consideration for EEG if not improving and pending tests are unrevealing as post-ictal state or frontal lobe etiology could cause similar symptoms in theory.  Patient's wife agreeable with plan.      ____________________________________________   FINAL CLINICAL IMPRESSION(S) / ED DIAGNOSES  Final diagnoses:  Somnolence  Confusion      NEW MEDICATIONS STARTED DURING THIS VISIT:  New Prescriptions   No medications on file     Note:  This document was prepared using Dragon voice recognition software and may include unintentional dictation errors.     Sharyn Creamer, MD 09/18/17 [phone]

## 2017-09-18 NOTE — ED Provider Notes (Signed)
-----------------------------------------   11:16 PM on 09/18/2017 -----------------------------------------  I took over care of this patient from Dr. Fanny BienQuale.  The patient is being evaluated for altered mental status and increased somnolence.  He was pending MRI and several other blood test at the time of discharge.  These have resulted within normal limits.  The patient continues to be somewhat somnolent although arousable.  Per the initial plan from Dr. Fanny BienQuale, I admitted him for further evaluation and possible neurology consultation and EEG.  I signed the patient out to the hospitalist Dr. Anne HahnWillis.   Dionne BucySiadecki, Shanai Lartigue, MD 09/18/17 2317

## 2017-09-18 NOTE — ED Triage Notes (Addendum)
States dizziness began yesterday. Wife states approx 10 episodes of same over past 2 years. History of diabetes. Patient is vague and lethargic in triage. Cannot give good history. Smile, grips and leg strength symmetrical.

## 2017-09-18 NOTE — ED Notes (Signed)
Requested patient to provide urine sample, states he cannot go at this time. Urinal at bedside. Wife aware of need for urine sample from patient.

## 2017-09-18 NOTE — ED Notes (Signed)
Pt resting again, rise and fall of chest noted. Family member denies any needs at this time.

## 2017-09-18 NOTE — ED Notes (Signed)
Jill AlexandersJustin, MRI tech, is taking pt for MRI at this time.

## 2017-09-18 NOTE — ED Notes (Signed)
Wife talking to MRI on the phone for screening questions. Jill AlexandersJustin, MRI tech stated he will be ready in about half an hour for pt's MRI.

## 2017-09-19 ENCOUNTER — Observation Stay
Admit: 2017-09-19 | Discharge: 2017-09-19 | Disposition: A | Discharge status: Home / Self Care | Payer: BLUE CROSS/BLUE SHIELD | Attending: Cardiovascular Disease | Admitting: Cardiovascular Disease

## 2017-09-19 ENCOUNTER — Observation Stay: Payer: BLUE CROSS/BLUE SHIELD

## 2017-09-19 DIAGNOSIS — R4189 Other symptoms and signs involving cognitive functions and awareness: Secondary | ICD-10-CM | POA: Diagnosis not present

## 2017-09-19 DIAGNOSIS — R55 Syncope and collapse: Secondary | ICD-10-CM | POA: Diagnosis present

## 2017-09-19 LAB — GLUCOSE, CAPILLARY
GLUCOSE-CAPILLARY: 100 mg/dL — AB (ref 70–99)
GLUCOSE-CAPILLARY: 151 mg/dL — AB (ref 70–99)
Glucose-Capillary: 126 mg/dL — ABNORMAL HIGH (ref 70–99)
Glucose-Capillary: 122 mg/dL — ABNORMAL HIGH (ref 70–99)

## 2017-09-19 LAB — URINE DRUG SCREEN, QUALITATIVE (ARMC ONLY)
AMPHETAMINES, UR SCREEN: NOT DETECTED
Barbiturates, Ur Screen: NOT DETECTED
Benzodiazepine, Ur Scrn: POSITIVE — AB
Cannabinoid 50 Ng, Ur ~~LOC~~: NOT DETECTED
Cocaine Metabolite,Ur ~~LOC~~: NOT DETECTED
MDMA (ECSTASY) UR SCREEN: NOT DETECTED
Methadone Scn, Ur: NOT DETECTED
OPIATE, UR SCREEN: NOT DETECTED
PHENCYCLIDINE (PCP) UR S: NOT DETECTED
Tricyclic, Ur Screen: POSITIVE — AB

## 2017-09-19 LAB — CBC
HCT: 40.3 % (ref 40.0–52.0)
Hemoglobin: 13.6 g/dL (ref 13.0–18.0)
MCH: 30.6 pg (ref 26.0–34.0)
MCHC: 33.8 g/dL (ref 32.0–36.0)
MCV: 90.3 fL (ref 80.0–100.0)
PLATELETS: 263 10*3/uL (ref 150–440)
RBC: 4.46 MIL/uL (ref 4.40–5.90)
RDW: 14.1 % (ref 11.5–14.5)
WBC: 6.9 10*3/uL (ref 3.8–10.6)

## 2017-09-19 LAB — BASIC METABOLIC PANEL
Anion gap: 4 — ABNORMAL LOW (ref 5–15)
BUN: 33 mg/dL — ABNORMAL HIGH (ref 6–20)
CALCIUM: 8.5 mg/dL — AB (ref 8.9–10.3)
CHLORIDE: 107 mmol/L (ref 98–111)
CO2: 30 mmol/L (ref 22–32)
CREATININE: 0.96 mg/dL (ref 0.61–1.24)
GFR calc Af Amer: >60 mL/min (ref >60)
GFR calc non Af Amer: >60 mL/min (ref >60)
GLUCOSE: 110 mg/dL — AB (ref 70–99)
Potassium: 4.2 mmol/L (ref 3.5–5.1)
Sodium: 141 mmol/L (ref 135–145)

## 2017-09-19 LAB — URINALYSIS, COMPLETE (UACMP) WITH MICROSCOPIC
BILIRUBIN URINE: NEGATIVE
Bacteria, UA: NONE SEEN
Glucose, UA: 50 mg/dL — AB
HGB URINE DIPSTICK: NEGATIVE
Ketones, ur: NEGATIVE mg/dL
Leukocytes, UA: NEGATIVE
Nitrite: NEGATIVE
PROTEIN: NEGATIVE mg/dL
Specific Gravity, Urine: 1.021 (ref 1.005–1.030)
pH: 5 (ref 5.0–8.0)

## 2017-09-19 LAB — ECHOCARDIOGRAM COMPLETE
Height: 71 in
Weight: 2878.4 oz

## 2017-09-19 MED ORDER — ACETAMINOPHEN 650 MG RE SUPP: 650.0000 mg | Freq: Four times a day (QID) | RECTAL | Status: DC | PRN

## 2017-09-19 MED ORDER — INSULIN ASPART 100 UNIT/ML ~~LOC~~ SOLN: 0.0000 [IU] | Freq: Every day | SUBCUTANEOUS | Status: DC

## 2017-09-19 MED ORDER — PNEUMOCOCCAL VAC POLYVALENT 25 MCG/0.5ML IJ INJ
0.5000 mL | INJECTION | INTRAMUSCULAR | Status: AC
  Administered 2017-09-20: 08:00:00 0.5 mL via INTRAMUSCULAR
  Filled 2017-09-19: qty 0.5

## 2017-09-19 MED ORDER — ONDANSETRON HCL 4 MG/2ML IJ SOLN: 4.0000 mg | Freq: Four times a day (QID) | INTRAMUSCULAR | Status: DC | PRN

## 2017-09-19 MED ORDER — ONDANSETRON HCL 4 MG PO TABS: 4.0000 mg | ORAL_TABLET | Freq: Four times a day (QID) | ORAL | Status: DC | PRN

## 2017-09-19 MED ORDER — ESCITALOPRAM OXALATE 10 MG PO TABS
20.0000 mg | ORAL_TABLET | Freq: Every day | ORAL | Status: DC
  Administered 2017-09-19 – 2017-09-20 (×2): 20 mg via ORAL
  Filled 2017-09-19 (×2): qty 2

## 2017-09-19 MED ORDER — INSULIN ASPART 100 UNIT/ML ~~LOC~~ SOLN
0.0000 [IU] | Freq: Three times a day (TID) | SUBCUTANEOUS | Status: DC
  Administered 2017-09-19: 2 [IU] via SUBCUTANEOUS
  Administered 2017-09-19: 18:00:00 1 [IU] via SUBCUTANEOUS
  Administered 2017-09-20: 08:00:00 2 [IU] via SUBCUTANEOUS
  Filled 2017-09-19 (×3): qty 1

## 2017-09-19 MED ORDER — ENOXAPARIN SODIUM 40 MG/0.4ML ~~LOC~~ SOLN
40.0000 mg | SUBCUTANEOUS | Status: DC
  Administered 2017-09-20: 06:00:00 40 mg via SUBCUTANEOUS
  Filled 2017-09-19: qty 0.4

## 2017-09-19 MED ORDER — ALLOPURINOL 100 MG PO TABS
100.0000 mg | ORAL_TABLET | Freq: Every day | ORAL | Status: DC
  Administered 2017-09-19 – 2017-09-20 (×2): 100 mg via ORAL
  Filled 2017-09-19 (×2): qty 1

## 2017-09-19 MED ORDER — ACETAMINOPHEN 325 MG PO TABS: 650.0000 mg | ORAL_TABLET | Freq: Four times a day (QID) | ORAL | Status: DC | PRN

## 2017-09-19 NOTE — H&P (Signed)
[facility] - [place] at [facility]   PATIENT NAME: Dennis Pineda    MR#:  [id number]  DATE OF BIRTH:  03-04-1960  DATE OF ADMISSION:  09/18/2017  PRIMARY CARE PHYSICIAN: Patient, No Pcp Per   REQUESTING/REFERRING PHYSICIAN: Siadecki, MD  CHIEF COMPLAINT:   Chief Complaint  Patient presents with  . Dizziness  . Altered Mental Status    HISTORY OF PRESENT ILLNESS:  Dennis Pineda  is a 57 y.o. male who presents with chief complaint as above.  She has had some confusion and decreased responsiveness since yesterday afternoon.  Wife states he has been intermittently confused, very drowsy and lethargic.  She states that he has a prescription for muscle relaxants but has not taken any for the past 24 hours.  He has a prior history of alcohol and narcotic use, but he is currently on Suboxone and she states that he has not been drinking alcohol recently.  He has had similar symptoms intermittently over the past year or so, but she states that he would typically come back around after about 12 hours of sleeping.  No overt seizure activity noted.  Patient remained lethargic here in the ED, when this writer went and interviewed the patient he woke up and was able to speak some, but could not recall details of why he was in the ED.  He did state that he had had several syncopal episodes this past week while working his job, which is [employer].  Hospitalist were asked to admit the patient for further evaluation  PAST MEDICAL HISTORY:   Past Medical History:  Diagnosis Date  . Arthritis    gout  . Depression   . Diabetes mellitus without complication (HCC)   . Hypertension      PAST SURGICAL HISTORY:   Past Surgical History:  Procedure Laterality Date  . APPENDECTOMY    . KNEE SURGERY    . ROTATOR CUFF REPAIR    . SHOULDER SURGERY       SOCIAL HISTORY:   Social History   Tobacco Use  . Smoking status: Former Smoker    Types: Cigarettes  .  Smokeless tobacco: Never Used  Substance Use Topics  . Alcohol use: No    Alcohol/week: 0.0 oz     FAMILY HISTORY:   Family History  Problem Relation Age of Onset  . Diabetes Mother   . Cancer Father        Liver  . Diabetes Father   . Diabetes Sister   . Diabetes Maternal Grandmother      DRUG ALLERGIES:  No Known Allergies  MEDICATIONS AT HOME:   Prior to Admission medications   Medication Sig Start Date End Date Taking? Authorizing Provider  SUBOXONE 8-2 MG FILM Take 1 Film by mouth 3 (three) times daily.  08/09/14  Yes [provider]  allopurinol (ZYLOPRIM) 100 MG tablet Take 100 mg by mouth daily as needed (for gout).    [provider]  allopurinol (ZYLOPRIM) 100 MG tablet Take 1 tablet (100 mg total) by mouth daily. 07/18/17 09/16/17  Menshew, Charlesetta Ivory, PA-C  colchicine 0.6 MG tablet Take 2 tabs PO x 1, then 1 tab PO 1 hour later x 1  Max: 1.8 mg total dose per attack, do not repeat within 3 days. 07/18/17   Menshew, Charlesetta Ivory, PA-C  cyclobenzaprine (FLEXERIL) 10 MG tablet Take 1 tablet (10 mg total) by mouth 3 (three) times daily as needed for muscle spasms. 09/13/17  Triplett, Cari B, FNP  escitalopram (LEXAPRO) 20 MG tablet Take 20 mg by mouth daily. 09/07/17   [provider]  meloxicam (MOBIC) 15 MG tablet Take 1 tablet (15 mg total) by mouth daily. 09/13/17   Kem Boroughs B, FNP  methylPREDNISolone (MEDROL DOSEPAK) 4 MG TBPK tablet 6-day taper as dircted Patient not taking: Reported on 09/18/2017 07/18/17   Menshew, Charlesetta Ivory, PA-C  Naphazoline HCl (CLEAR EYES OP) Place 2 drops into both eyes daily as needed (for red eyes).    [provider]  naproxen (NAPROSYN) 500 MG tablet Take 1 tablet (500 mg total) by mouth 2 (two) times daily with a meal. 11/24/13   Harris, Abigail, PA-C  sertraline (ZOLOFT) 100 MG tablet Take 1 tablet (100 mg total) by mouth daily. 10/29/13   Le, Thao P, DO    REVIEW OF SYSTEMS:  Review of  Systems  Constitutional: Negative for chills, fever, malaise/fatigue and weight loss.  HENT: Negative for ear pain, hearing loss and tinnitus.   Eyes: Negative for blurred vision, double vision, pain and redness.  Respiratory: Negative for cough, hemoptysis and shortness of breath.   Cardiovascular: Negative for chest pain, palpitations, orthopnea and leg swelling.  Gastrointestinal: Negative for abdominal pain, constipation, diarrhea, nausea and vomiting.  Genitourinary: Negative for dysuria, frequency and hematuria.  Musculoskeletal: Negative for back pain, joint pain and neck pain.  Skin:       No acne, rash, or lesions  Neurological: Positive for loss of consciousness. Negative for dizziness, tremors, focal weakness and weakness.  Endo/Heme/Allergies: Negative for polydipsia. Does not bruise/bleed easily.  Psychiatric/Behavioral: Negative for depression. The patient is not nervous/anxious and does not have insomnia.      VITAL SIGNS:   Vitals:   09/18/17 2230 09/18/17 2300 09/18/17 2330 09/19/17 0000  BP: 117/80 128/88 103/78 116/79  Pulse: (!) 58 64 (!) 58 (!) 58  Resp: 17 13 15 16   Temp:      TempSrc:      SpO2: 95% 96% 94% 94%  Weight:      Height:       Wt Readings from Last 3 Encounters:  09/18/17 80.7 kg (178 lb)  09/13/17 80.7 kg (178 lb)  07/18/17 81.6 kg (180 lb)    PHYSICAL EXAMINATION:  Physical Exam  Vitals reviewed. Constitutional: He appears well-developed and well-nourished. No distress.  HENT:  Head: Normocephalic and atraumatic.  Mouth/Throat: Oropharynx is clear and moist.  Eyes: Pupils are equal, round, and reactive to light. Conjunctivae and EOM are normal. No scleral icterus.  Neck: Normal range of motion. Neck supple. No JVD present. No thyromegaly present.  Cardiovascular: Normal rate, regular rhythm and intact distal pulses. Exam reveals no gallop and no friction rub.  No murmur heard. Respiratory: Effort normal and breath sounds normal. No  respiratory distress. He has no wheezes. He has no rales.  GI: Soft. Bowel sounds are normal. He exhibits no distension. There is no tenderness.  Musculoskeletal: Normal range of motion. He exhibits no edema.  No arthritis, no gout  Lymphadenopathy:    He has no cervical adenopathy.  Neurological: He is alert. No cranial nerve deficit.  No dysarthria, no aphasia, patient is questionably oriented  Skin: Skin is warm and dry. No rash noted. No erythema.  Psychiatric: He has a normal mood and affect. His behavior is normal. Judgment and thought content normal.    LABORATORY PANEL:   CBC Recent Labs  Lab 09/18/17 1704  WBC 8.4  HGB  14.6  HCT 42.9  PLT 278   ------------------------------------------------------------------------------------------------------------------  Chemistries  Recent Labs  Lab 09/18/17 1704  NA 137  K 4.2  CL 102  CO2 24  GLUCOSE 233*  BUN 37*  CREATININE 1.06  CALCIUM 9.1  AST 27  ALT 21  ALKPHOS 76  BILITOT 0.5   ------------------------------------------------------------------------------------------------------------------  Cardiac Enzymes Recent Labs  Lab 09/18/17 1704  TROPONINI <0.03   ------------------------------------------------------------------------------------------------------------------  RADIOLOGY:  Ct Head Wo Contrast  Result Date: 09/18/2017 CLINICAL DATA:  Altered mental status with dizziness EXAM: CT HEAD WITHOUT CONTRAST TECHNIQUE: Contiguous axial images were obtained from the base of the skull through the vertex without intravenous contrast. COMPARISON:  CT brain 11/24/2013 FINDINGS: Brain: No evidence of acute infarction, hemorrhage, hydrocephalus, extra-axial collection or mass lesion/mass effect. Vascular: No hyperdense vessel. Scattered calcification at the carotid siphon. Skull: Normal. Negative for fracture or focal lesion. Sinuses/Orbits: No acute finding. Other: None IMPRESSION: Negative non contrasted CT  appearance of the brain. Electronically Signed   By: Jasmine PangKim  Fujinaga M.D.   On: 09/18/2017 18:05   Mr Brain Wo Contrast  Result Date: 09/18/2017 CLINICAL DATA:  Dizziness since yesterday. Lethargy. Ten prior episodes over last 2 years. History of hypertension, diabetes. EXAM: MRI HEAD WITHOUT CONTRAST TECHNIQUE: Multiplanar, multiecho pulse sequences of the brain and surrounding structures were obtained without intravenous contrast. COMPARISON:  CT HEAD September 18, 2017 FINDINGS: Mild motion degraded examination. INTRACRANIAL CONTENTS: No reduced diffusion to suggest acute ischemia or hyperacute demyelination. No susceptibility artifact to suggest hemorrhage. The ventricles and sulci are normal for patient's age. No suspicious parenchymal signal, masses, mass effect. No abnormal extra-axial fluid collections. No extra-axial masses. VASCULAR: Normal major intracranial vascular flow voids present at skull base. SKULL AND UPPER CERVICAL SPINE: No abnormal sellar expansion. No suspicious calvarial bone marrow signal. Craniocervical junction maintained. SINUSES/ORBITS: The mastoid air-cells and included paranasal sinuses are well-aerated.The included ocular globes and orbital contents are non-suspicious. OTHER: None. IMPRESSION: Negative motion degraded noncontrast MRI head. Electronically Signed   By: Awilda Metroourtnay  Bloomer M.D.   On: 09/18/2017 22:09   Dg Chest Port 1 View  Result Date: 09/18/2017 CLINICAL DATA:  Dizziness lethargy EXAM: PORTABLE CHEST 1 VIEW COMPARISON:  09/14/2014 FINDINGS: Low lung volumes. Airspace disease at the left greater than right lung base. Borderline cardiomegaly. No effusion. No pneumothorax. IMPRESSION: Low lung volumes with left greater than right basilar atelectasis or infiltrate. Electronically Signed   By: Jasmine PangKim  Fujinaga M.D.   On: 09/18/2017 18:59    EKG:   Orders placed or performed during the hospital encounter of 09/18/17  . EKG 12-Lead  . EKG 12-Lead  . ED EKG  . ED EKG     IMPRESSION AND PLAN:  Principal Problem:   Decreased responsiveness -unclear etiology, history is not very consistent with seizure events, patient has had some syncopes, but does not relate any syncopal event to his acute symptoms.  He seems to be waking up more now than he was before.  We will continue to monitor his level of consciousness, urine drug screen is pending. Active Problems:   Diabetes mellitus without complication (HCC) -sliding scale insulin with corresponding glucose checks   Hypertension -home dose antihypertensives   Syncope -echocardiogram and cardiology consult   Depression -home dose antidepressant   Chart review performed and case discussed with ED provider. Labs, imaging and/or ECG reviewed by provider and discussed with patient/family. Management plans discussed with the patient and/or family.  DVT PROPHYLAXIS: SubQ lovenox   GI  PROPHYLAXIS:  None  ADMISSION STATUS:   Observation  CODE STATUS: Full Code Status History    Date Active Date Inactive Code Status Order ID Comments User Context   09/14/2014 1041 09/15/2014 1726 Full Code [id number]  Houston Siren, MD Inpatient      TOTAL TIME TAKING CARE OF THIS PATIENT: 40 minutes.   Gaylene Moylan FIELDING 09/19/2017, 12:46 AM  [facility]  Office  [phone]  CC: Primary care physician; Patient, No Pcp Per  Note:  This document was prepared using Dragon voice recognition software and may include unintentional dictation errors.

## 2017-09-19 NOTE — Progress Notes (Signed)
*  PRELIMINARY RESULTS* Echocardiogram 2D Echocardiogram has been performed.  Cristela BlueHege, Demonte Dobratz 09/19/2017, 10:25 AM

## 2017-09-19 NOTE — Progress Notes (Signed)
[facility] - [place] at [facility]   PATIENT NAME: Dennis Pineda    MR#:  [id number]  DATE OF BIRTH:  12-03-1960  SUBJECTIVE:  CHIEF COMPLAINT:   Chief Complaint  Patient presents with  . Dizziness  . Altered Mental Status  Patient seen and evaluated today Is awake and alert According to the family member he has spells of confusion No complaints of any seizures Work-up so far negative which include CT head, MRI brain and echocardiogram REVIEW OF SYSTEMS:    ROS  CONSTITUTIONAL: No documented fever. No fatigue, weakness. No weight gain, no weight loss.  EYES: No blurry or double vision.  ENT: No tinnitus. No postnasal drip. No redness of the oropharynx.  RESPIRATORY: No cough, no wheeze, no hemoptysis. No dyspnea.  CARDIOVASCULAR: No chest pain. No orthopnea. No palpitations. No syncope.  GASTROINTESTINAL: No nausea, no vomiting or diarrhea. No abdominal pain. No melena or hematochezia.  GENITOURINARY: No dysuria or hematuria.  ENDOCRINE: No polyuria or nocturia. No heat or cold intolerance.  HEMATOLOGY: No anemia. No bruising. No bleeding.  INTEGUMENTARY: No rashes. No lesions.  MUSCULOSKELETAL: No arthritis. No swelling. No gout.  NEUROLOGIC: No numbness, tingling, or ataxia. No seizure-type activity.  PSYCHIATRIC: No anxiety. No insomnia. No ADD.   DRUG ALLERGIES:  No Known Allergies  VITALS:  Blood pressure 111/78, pulse 61, temperature 98.1 F (36.7 C), temperature source Oral, resp. rate 20, height 5\' 11"  (1.803 m), weight 81.6 kg (179 lb 14.4 oz), SpO2 97 %.  PHYSICAL EXAMINATION:   Physical Exam  GENERAL:  57 y.o.-year-old patient lying in the bed with no acute distress.  EYES: Pupils equal, round, reactive to light and accommodation. No scleral icterus. Extraocular muscles intact.  HEENT: Head atraumatic, normocephalic. Oropharynx and nasopharynx clear.  NECK:  Supple, no jugular venous distention. No thyroid enlargement, no tenderness.   LUNGS: Normal breath sounds bilaterally, no wheezing, rales, rhonchi. No use of accessory muscles of respiration.  CARDIOVASCULAR: S1, S2 normal. No murmurs, rubs, or gallops.  ABDOMEN: Soft, nontender, nondistended. Bowel sounds present. No organomegaly or mass.  EXTREMITIES: No cyanosis, clubbing or edema b/l.    NEUROLOGIC: Cranial nerves II through XII are intact. No focal Motor or sensory deficits b/l.   PSYCHIATRIC: The patient is alert and oriented x 3.  SKIN: No obvious rash, lesion, or ulcer.   LABORATORY PANEL:   CBC Recent Labs  Lab 09/19/17 0349  WBC 6.9  HGB 13.6  HCT 40.3  PLT 263   ------------------------------------------------------------------------------------------------------------------ Chemistries  Recent Labs  Lab 09/18/17 1704 09/19/17 0349  NA 137 141  K 4.2 4.2  CL 102 107  CO2 24 30  GLUCOSE 233* 110*  BUN 37* 33*  CREATININE 1.06 0.96  CALCIUM 9.1 8.5*  AST 27  --   ALT 21  --   ALKPHOS 76  --   BILITOT 0.5  --    ------------------------------------------------------------------------------------------------------------------  Cardiac Enzymes Recent Labs  Lab 09/18/17 1704  TROPONINI <0.03   ------------------------------------------------------------------------------------------------------------------  RADIOLOGY:  Ct Head Wo Contrast  Result Date: 09/18/2017 CLINICAL DATA:  Altered mental status with dizziness EXAM: CT HEAD WITHOUT CONTRAST TECHNIQUE: Contiguous axial images were obtained from the base of the skull through the vertex without intravenous contrast. COMPARISON:  CT brain 11/24/2013 FINDINGS: Brain: No evidence of acute infarction, hemorrhage, hydrocephalus, extra-axial collection or mass lesion/mass effect. Vascular: No hyperdense vessel. Scattered calcification at the carotid siphon. Skull: Normal. Negative for fracture or focal lesion. Sinuses/Orbits: No acute finding. Other:  None IMPRESSION: Negative non  contrasted CT appearance of the brain. Electronically Signed   By: Jasmine PangKim  Fujinaga M.D.   On: 09/18/2017 18:05   Mr Brain Wo Contrast  Result Date: 09/18/2017 CLINICAL DATA:  Dizziness since yesterday. Lethargy. Ten prior episodes over last 2 years. History of hypertension, diabetes. EXAM: MRI HEAD WITHOUT CONTRAST TECHNIQUE: Multiplanar, multiecho pulse sequences of the brain and surrounding structures were obtained without intravenous contrast. COMPARISON:  CT HEAD September 18, 2017 FINDINGS: Mild motion degraded examination. INTRACRANIAL CONTENTS: No reduced diffusion to suggest acute ischemia or hyperacute demyelination. No susceptibility artifact to suggest hemorrhage. The ventricles and sulci are normal for patient's age. No suspicious parenchymal signal, masses, mass effect. No abnormal extra-axial fluid collections. No extra-axial masses. VASCULAR: Normal major intracranial vascular flow voids present at skull base. SKULL AND UPPER CERVICAL SPINE: No abnormal sellar expansion. No suspicious calvarial bone marrow signal. Craniocervical junction maintained. SINUSES/ORBITS: The mastoid air-cells and included paranasal sinuses are well-aerated.The included ocular globes and orbital contents are non-suspicious. OTHER: None. IMPRESSION: Negative motion degraded noncontrast MRI head. Electronically Signed   By: Awilda Metroourtnay  Bloomer M.D.   On: 09/18/2017 22:09   Dg Chest Port 1 View  Result Date: 09/18/2017 CLINICAL DATA:  Dizziness lethargy EXAM: PORTABLE CHEST 1 VIEW COMPARISON:  09/14/2014 FINDINGS: Low lung volumes. Airspace disease at the left greater than right lung base. Borderline cardiomegaly. No effusion. No pneumothorax. IMPRESSION: Low lung volumes with left greater than right basilar atelectasis or infiltrate. Electronically Signed   By: Jasmine PangKim  Fujinaga M.D.   On: 09/18/2017 18:59     ASSESSMENT AND PLAN:  57 year old male patient with history of depression, diabetes mellitus type 2, hypertension,  gout currently under hospitalist service for spells of confusion and altered mental status  -Disorientation/altered mental status on and off No acute pathology so far Unclear etiology Neurology consultation appreciated Follow-up EEG for evidence of any seizure activity  -Syncope Questionable Troponins negative echocardiogram normal Status post cardiology evaluation no further recommendations advised  -Type 2 diabetes mellitus Diabetic diet with sliding scale coverage with insulin  -DVT prophylaxis subcu Lovenox daily   All the records are reviewed and case discussed with Care Management/Social Worker. Management plans discussed with the patient, family and they are in agreement.  CODE STATUS: Full code  DVT Prophylaxis: SCDs  TOTAL TIME TAKING CARE OF THIS PATIENT: 35 minutes.   POSSIBLE D/C IN 1 to 2 DAYS, DEPENDING ON CLINICAL CONDITION.  Ihor AustinPavan Lamon Rotundo M.D on 09/19/2017 at 2:41 PM  Between 7am to 6pm - Pager - [phone]  After 6pm go to [email or web address] - password [email or web address]  [facility] [place] [facility]  Office  [phone]  CC: Primary care physician; Patient, No Pcp Per  Note: This dictation was prepared with Dragon dictation along with smaller phrase technology. Any transcriptional errors that result from this process are unintentional.

## 2017-09-19 NOTE — Progress Notes (Signed)
eeg completed ° °

## 2017-09-19 NOTE — Plan of Care (Signed)
  Problem: Clinical Measurements: Goal: Ability to maintain clinical measurements within normal limits will improve Outcome: Progressing   Problem: Safety: Goal: Ability to remain free from injury will improve Outcome: Progressing   

## 2017-09-19 NOTE — Consult Note (Signed)
Reason for Consult:Episodic confusion Referring Physician: Pyreddy  CC: Episodic confusion  HPI: Dennis Pineda is an 57 y.o. male presenting with confusion.  Patient is unable to provide history today therefore all history obtained from his wife.  It appears that the patient began to have these episodes about 5 years ago.  Had then for a couple of years and they resolved spontaneously for the past 3 years until about a month ago.  He has now had 2 in one month.  Per wife she reports that the patient starts out confused or at times may become a little aggressive.  He then sleeps for about 2 days and is back to normal.  He has never previously sought medical attention.   The patient is on Suboxone but neither the patient nor the wife can tell me when his prescription was written.  He must attend weekly counseling sessions.  Was recently placed on Flexeril for back pain but wife reports that she keeps that with her.    Past Medical History:  Diagnosis Date  . Arthritis    gout  . Depression   . Diabetes mellitus without complication (HCC)   . Hypertension     Past Surgical History:  Procedure Laterality Date  . APPENDECTOMY    . KNEE SURGERY    . ROTATOR CUFF REPAIR    . SHOULDER SURGERY      Family History  Problem Relation Age of Onset  . Diabetes Mother   . Cancer Father        Liver  . Diabetes Father   . Diabetes Sister   . Diabetes Maternal Grandmother     Social History:  reports that he has quit smoking. His smoking use included cigarettes. He has never used smokeless tobacco. He reports that he does not drink alcohol or use drugs.  No Known Allergies  Medications:  I have reviewed the patient's current medications. Prior to Admission:  Medications Prior to Admission  Medication Sig Dispense Refill Last Dose  . cyclobenzaprine (FLEXERIL) 10 MG tablet Take 1 tablet (10 mg total) by mouth 3 (three) times daily as needed for muscle spasms. 30 tablet 0 09/18/2017 at  Unknown time  . escitalopram (LEXAPRO) 20 MG tablet Take 20 mg by mouth daily.  5 09/18/2017 at Unknown time  . naproxen (NAPROSYN) 500 MG tablet Take 1 tablet (500 mg total) by mouth 2 (two) times daily with a meal. 30 tablet 0 09/18/2017 at Unknown time  . sertraline (ZOLOFT) 100 MG tablet Take 1 tablet (100 mg total) by mouth daily. 30 tablet 1 09/18/2017 at Unknown time  . SUBOXONE 8-2 MG FILM Take 1 Film by mouth 3 (three) times daily.   0 09/18/2017 at Unknown time  . allopurinol (ZYLOPRIM) 100 MG tablet Take 100 mg by mouth daily as needed (for gout).   Not Taking at Unknown time  . allopurinol (ZYLOPRIM) 100 MG tablet Take 1 tablet (100 mg total) by mouth daily. 30 tablet 1   . colchicine 0.6 MG tablet Take 2 tabs PO x 1, then 1 tab PO 1 hour later x 1  Max: 1.8 mg total dose per attack, do not repeat within 3 days. (Patient not taking: Reported on 09/19/2017) 10 tablet 0 Not Taking at Unknown time  . meloxicam (MOBIC) 15 MG tablet Take 1 tablet (15 mg total) by mouth daily. 30 tablet 0   . methylPREDNISolone (MEDROL DOSEPAK) 4 MG TBPK tablet 6-day taper as dircted (Patient not taking: Reported on  09/18/2017) 21 tablet 0 Completed Course at Unknown time  . Naphazoline HCl (CLEAR EYES OP) Place 2 drops into both eyes daily as needed (for red eyes).   11/24/2013 at Unknown time   Scheduled: . allopurinol  100 mg Oral Daily  . enoxaparin (LOVENOX) injection  40 mg Subcutaneous Q24H  . escitalopram  20 mg Oral Daily  . insulin aspart  0-5 Units Subcutaneous QHS  . insulin aspart  0-9 Units Subcutaneous TID WC  . [START ON 09/20/2017] pneumococcal 23 valent vaccine  0.5 mL Intramuscular Tomorrow-1000    ROS: Unable to provide due to lethargy  Physical Examination: Blood pressure (!) 126/97, pulse (!) 55, temperature 97.6 F (36.4 C), temperature source Oral, resp. rate 20, height 5\' 11"  (1.803 m), weight 81.6 kg (179 lb 14.4 oz), SpO2 97 %.  HEENT-  Normocephalic, no lesions, without obvious  abnormality.  Normal external eye and conjunctiva.  Normal TM's bilaterally.  Normal auditory canals and external ears. Normal external nose, mucus membranes and septum.  Normal pharynx. Cardiovascular- S1, S2 normal, pulses palpable throughout   Lungs- chest clear, no wheezing, rales, normal symmetric air entry Abdomen- soft, non-tender; bowel sounds normal; no masses,  no organomegaly Extremities- no edema Lymph-no adenopathy palpable Musculoskeletal-no joint tenderness, deformity or swelling Skin-warm and dry, no hyperpigmentation, vitiligo, or suspicious lesions  Neurological Examination   Mental Status: Lethargic and difficult to maintain arousal for examination.  Speech fluent.  Able to follow commands with extensive reinforcement.  Knows where he is and his name.  Able to recognize people in the room.   Cranial Nerves: II: Discs flat bilaterally; Visual fields grossly normal, pupils equal, round, reactive to light and accommodation III,IV, VI: ptosis not present, extra-ocular motions intact bilaterally V,VII: smile symmetric, facial light touch sensation normal bilaterally VIII: hearing normal bilaterally IX,X: gag reflex present XI: bilateral shoulder shrug XII: midline tongue extension Motor: Right : Upper extremity   5/5    Left:     Upper extremity   5/5  Lower extremity   5/5     Lower extremity   5/5 Tone and bulk:normal tone throughout; no atrophy noted Sensory: Pinprick and light touch intact throughout, bilaterally Deep Tendon Reflexes: 2+ and symmetric with absent AJ'a bilaterally.  Right KJ not performed secondary to previous injury.   Plantars: Right: mute   Left: mute Cerebellar: Normal finger-to-nose and normal heel-to-shin testing bilaterally Gait: not tested due to inability to stay awake.     Laboratory Studies:   Basic Metabolic Panel: Recent Labs  Lab 09/18/17 1704 09/19/17 0349  NA 137 141  K 4.2 4.2  CL 102 107  CO2 24 30  GLUCOSE 233* 110*  BUN  37* 33*  CREATININE 1.06 0.96  CALCIUM 9.1 8.5*    Liver Function Tests: Recent Labs  Lab 09/18/17 1704  AST 27  ALT 21  ALKPHOS 76  BILITOT 0.5  PROT 7.4  ALBUMIN 4.2   No results for input(s): LIPASE, AMYLASE in the last 168 hours. Recent Labs  Lab 09/18/17 1837  AMMONIA 35    CBC: Recent Labs  Lab 09/18/17 1704 09/19/17 0349  WBC 8.4 6.9  HGB 14.6 13.6  HCT 42.9 40.3  MCV 89.0 90.3  PLT 278 263    Cardiac Enzymes: Recent Labs  Lab 09/18/17 1704  TROPONINI <0.03    BNP: Invalid input(s): POCBNP  CBG: Recent Labs  Lab 09/18/17 1700 09/18/17 1834 09/18/17 2022 09/19/17 0745 09/19/17 1134  GLUCAP 218* 177* 143*  100* 151*    Microbiology: Results for orders placed or performed during the hospital encounter of 09/14/14  MRSA PCR Screening     Status: None   Collection Time: 09/14/14 10:54 AM  Result Value Ref Range Status   MRSA by PCR NEGATIVE NEGATIVE Final    Comment:        The GeneXpert MRSA Assay (FDA approved for NASAL specimens only), is one component of a comprehensive MRSA colonization surveillance program. It is not intended to diagnose MRSA infection nor to guide or monitor treatment for MRSA infections.     Coagulation Studies: No results for input(s): LABPROT, INR in the last 72 hours.  Urinalysis:  Recent Labs  Lab 09/13/17 1628 09/19/17 0135  COLORURINE YELLOW* YELLOW*  LABSPEC 1.036* 1.021  PHURINE 5.0 5.0  GLUCOSEU >=500* 50*  HGBUR NEGATIVE NEGATIVE  BILIRUBINUR NEGATIVE NEGATIVE  KETONESUR 5* NEGATIVE  PROTEINUR NEGATIVE NEGATIVE  NITRITE NEGATIVE NEGATIVE  LEUKOCYTESUR NEGATIVE NEGATIVE    Lipid Panel:  No results found for: CHOL, TRIG, HDL, CHOLHDL, VLDL, LDLCALC  HgbA1C:  Lab Results  Component Value Date   HGBA1C 6.5 10/29/2013    Urine Drug Screen:      Component Value Date/Time   LABOPIA NONE DETECTED 09/19/2017 0135   COCAINSCRNUR NONE DETECTED 09/19/2017 0135   LABBENZ POSITIVE (A)  09/19/2017 0135   AMPHETMU NONE DETECTED 09/19/2017 0135   THCU NONE DETECTED 09/19/2017 0135   LABBARB NONE DETECTED 09/19/2017 0135    Alcohol Level:  Recent Labs  Lab 09/18/17 1837  ETH <10    Other results: EKG: normal sinus rhythm at 83 bpm.  Imaging: Ct Head Wo Contrast  Result Date: 09/18/2017 CLINICAL DATA:  Altered mental status with dizziness EXAM: CT HEAD WITHOUT CONTRAST TECHNIQUE: Contiguous axial images were obtained from the base of the skull through the vertex without intravenous contrast. COMPARISON:  CT brain 11/24/2013 FINDINGS: Brain: No evidence of acute infarction, hemorrhage, hydrocephalus, extra-axial collection or mass lesion/mass effect. Vascular: No hyperdense vessel. Scattered calcification at the carotid siphon. Skull: Normal. Negative for fracture or focal lesion. Sinuses/Orbits: No acute finding. Other: None IMPRESSION: Negative non contrasted CT appearance of the brain. Electronically Signed   By: Jasmine PangKim  Fujinaga M.D.   On: 09/18/2017 18:05   Mr Brain Wo Contrast  Result Date: 09/18/2017 CLINICAL DATA:  Dizziness since yesterday. Lethargy. Ten prior episodes over last 2 years. History of hypertension, diabetes. EXAM: MRI HEAD WITHOUT CONTRAST TECHNIQUE: Multiplanar, multiecho pulse sequences of the brain and surrounding structures were obtained without intravenous contrast. COMPARISON:  CT HEAD September 18, 2017 FINDINGS: Mild motion degraded examination. INTRACRANIAL CONTENTS: No reduced diffusion to suggest acute ischemia or hyperacute demyelination. No susceptibility artifact to suggest hemorrhage. The ventricles and sulci are normal for patient's age. No suspicious parenchymal signal, masses, mass effect. No abnormal extra-axial fluid collections. No extra-axial masses. VASCULAR: Normal major intracranial vascular flow voids present at skull base. SKULL AND UPPER CERVICAL SPINE: No abnormal sellar expansion. No suspicious calvarial bone marrow signal.  Craniocervical junction maintained. SINUSES/ORBITS: The mastoid air-cells and included paranasal sinuses are well-aerated.The included ocular globes and orbital contents are non-suspicious. OTHER: None. IMPRESSION: Negative motion degraded noncontrast MRI head. Electronically Signed   By: Awilda Metroourtnay  Bloomer M.D.   On: 09/18/2017 22:09   Dg Chest Port 1 View  Result Date: 09/18/2017 CLINICAL DATA:  Dizziness lethargy EXAM: PORTABLE CHEST 1 VIEW COMPARISON:  09/14/2014 FINDINGS: Low lung volumes. Airspace disease at the left greater than right lung base. Borderline  cardiomegaly. No effusion. No pneumothorax. IMPRESSION: Low lung volumes with left greater than right basilar atelectasis or infiltrate. Electronically Signed   By: Jasmine Pang M.D.   On: 09/18/2017 18:59     Assessment/Plan: 57 year old male presenting with episodes of confusion and lethargy.  No evidence of infection with no fever and/or elevated white blood cell count.  MRI of the brain reviewed and shows no acute changes.  UDS positive for benzos and tricyclics.  Based on prescribed medications it is unclear what is causing these results.  Unable to do med count for Suboxone at this time as well since wife does not know where to find medications.  Concern is that episodes may be secondary to medication. TSH and ammonia are normal.  CMET only remarkable for an elevated BUN.    Recommendations: 1. EEG   Thana Farr, MD Neurology [phone] 09/19/2017, 12:32 PM

## 2017-09-19 NOTE — Consult Note (Signed)
Dennis Pineda is a 57 y.o. male  [id number]  Primary Cardiologist: Jarmal Lewelling Reason for Consultation: syncope  HPI: 72 YOM came with decreased responsiveness and has been on sedatives such as benzodiazepines. He had passed out several times.   Review of Systems: right lower abdominal pain   Past Medical History:  Diagnosis Date  . Arthritis    gout  . Depression   . Diabetes mellitus without complication (HCC)   . Hypertension     Medications Prior to Admission  Medication Sig Dispense Refill  . cyclobenzaprine (FLEXERIL) 10 MG tablet Take 1 tablet (10 mg total) by mouth 3 (three) times daily as needed for muscle spasms. 30 tablet 0  . escitalopram (LEXAPRO) 20 MG tablet Take 20 mg by mouth daily.  5  . naproxen (NAPROSYN) 500 MG tablet Take 1 tablet (500 mg total) by mouth 2 (two) times daily with a meal. 30 tablet 0  . sertraline (ZOLOFT) 100 MG tablet Take 1 tablet (100 mg total) by mouth daily. 30 tablet 1  . SUBOXONE 8-2 MG FILM Take 1 Film by mouth 3 (three) times daily.   0  . allopurinol (ZYLOPRIM) 100 MG tablet Take 100 mg by mouth daily as needed (for gout).    Marland Kitchen allopurinol (ZYLOPRIM) 100 MG tablet Take 1 tablet (100 mg total) by mouth daily. 30 tablet 1  . colchicine 0.6 MG tablet Take 2 tabs PO x 1, then 1 tab PO 1 hour later x 1  Max: 1.8 mg total dose per attack, do not repeat within 3 days. (Patient not taking: Reported on 09/19/2017) 10 tablet 0  . meloxicam (MOBIC) 15 MG tablet Take 1 tablet (15 mg total) by mouth daily. 30 tablet 0  . methylPREDNISolone (MEDROL DOSEPAK) 4 MG TBPK tablet 6-day taper as dircted (Patient not taking: Reported on 09/18/2017) 21 tablet 0  . Naphazoline HCl (CLEAR EYES OP) Place 2 drops into both eyes daily as needed (for red eyes).       Marland Kitchen allopurinol  100 mg Oral Daily  . enoxaparin (LOVENOX) injection  40 mg Subcutaneous Q24H  . escitalopram  20 mg Oral Daily  . insulin aspart  0-5 Units Subcutaneous QHS  . insulin  aspart  0-9 Units Subcutaneous TID WC  . [START ON 09/20/2017] pneumococcal 23 valent vaccine  0.5 mL Intramuscular Tomorrow-1000    Infusions:   No Known Allergies  Social History   Socioeconomic History  . Marital status: Married    Spouse name: Not on file  . Number of children: Not on file  . Years of education: Not on file  . Highest education level: Not on file  Occupational History  . Not on file  Social Needs  . Financial resource strain: Not hard at all  . Food insecurity:    Worry: Never true    Inability: Never true  . Transportation needs:    Medical: Yes    Non-medical: Yes  Tobacco Use  . Smoking status: Former Smoker    Types: Cigarettes  . Smokeless tobacco: Never Used  Substance and Sexual Activity  . Alcohol use: No    Alcohol/week: 0.0 oz  . Drug use: No  . Sexual activity: Yes  Lifestyle  . Physical activity:    Days per week: 4 days    Minutes per session: 40 min  . Stress: To some extent  Relationships  . Social connections:    Talks on phone: Twice a week  Gets together: Twice a week    Attends religious service: Never    Active member of club or organization: No    Attends meetings of clubs or organizations: Never    Relationship status: Married  . Intimate partner violence:    Fear of current or ex partner: No    Emotionally abused: No    Physically abused: No    Forced sexual activity: No  Other Topics Concern  . Not on file  Social History Narrative   Lives with wife, works, drives.    Family History  Problem Relation Age of Onset  . Diabetes Mother   . Cancer Father        Liver  . Diabetes Father   . Diabetes Sister   . Diabetes Maternal Grandmother     PHYSICAL EXAM: Vitals:   09/19/17 0645 09/19/17 0806  BP:  (!) 126/97  Pulse:  (!) 55  Resp:  20  Temp: (!) 97.4 F (36.3 C) 97.6 F (36.4 C)  SpO2:  97%    No intake or output data in the 24 hours ending 09/19/17 0942  General:  Well appearing. No  respiratory difficulty HEENT: normal Neck: supple. no JVD. Carotids 2+ bilat; no bruits. No lymphadenopathy or thryomegaly appreciated. Cor: PMI nondisplaced. Regular rate & rhythm. No rubs, gallops or murmurs. Lungs: clear Abdomen: soft, nontender, nondistended. No hepatosplenomegaly. No bruits or masses. Good bowel sounds. Extremities: no cyanosis, clubbing, rash, edema Neuro: alert & oriented x 3, cranial nerves grossly intact. moves all 4 extremities w/o difficulty. Affect pleasant.  ECG: NSR no accute changes  Results for orders placed or performed during the hospital encounter of 09/18/17 (from the past 24 hour(s))  Glucose, capillary     Status: Abnormal   Collection Time: 09/18/17  5:00 PM  Result Value Ref Range   Glucose-Capillary 218 (H) 70 - 99 mg/dL  CBC     Status: None   Collection Time: 09/18/17  5:04 PM  Result Value Ref Range   WBC 8.4 3.8 - 10.6 K/uL   RBC 4.82 4.40 - 5.90 MIL/uL   Hemoglobin 14.6 13.0 - 18.0 g/dL   HCT 81.142.9 91.440.0 - 78.252.0 %   MCV 89.0 80.0 - 100.0 fL   MCH 30.3 26.0 - 34.0 pg   MCHC 34.1 32.0 - 36.0 g/dL   RDW 95.614.0 21.311.5 - 08.614.5 %   Platelets 278 150 - 440 K/uL  Comprehensive metabolic panel     Status: Abnormal   Collection Time: 09/18/17  5:04 PM  Result Value Ref Range   Sodium 137 135 - 145 mmol/L   Potassium 4.2 3.5 - 5.1 mmol/L   Chloride 102 98 - 111 mmol/L   CO2 24 22 - 32 mmol/L   Glucose, Bld 233 (H) 70 - 99 mg/dL   BUN 37 (H) 6 - 20 mg/dL   Creatinine, Ser 5.781.06 0.61 - 1.24 mg/dL   Calcium 9.1 8.9 - 46.910.3 mg/dL   Total Protein 7.4 6.5 - 8.1 g/dL   Albumin 4.2 3.5 - 5.0 g/dL   AST 27 15 - 41 U/L   ALT 21 0 - 44 U/L   Alkaline Phosphatase 76 38 - 126 U/L   Total Bilirubin 0.5 0.3 - 1.2 mg/dL   GFR calc non Af Amer >60 >60 mL/min   GFR calc Af Amer >60 >60 mL/min   Anion gap 11 5 - 15  Troponin I     Status: None   Collection Time: 09/18/17  5:04 PM  Result Value Ref Range   Troponin I <0.03 <0.03 ng/mL  Salicylate level      Status: None   Collection Time: 09/18/17  5:39 PM  Result Value Ref Range   Salicylate Lvl <7.0 2.8 - 30.0 mg/dL  Acetaminophen level     Status: Abnormal   Collection Time: 09/18/17  5:39 PM  Result Value Ref Range   Acetaminophen (Tylenol), Serum <10 (L) 10 - 30 ug/mL  TSH     Status: None   Collection Time: 09/18/17  6:00 PM  Result Value Ref Range   TSH 1.918 0.350 - 4.500 uIU/mL  Glucose, capillary     Status: Abnormal   Collection Time: 09/18/17  6:34 PM  Result Value Ref Range   Glucose-Capillary 177 (H) 70 - 99 mg/dL  Ammonia     Status: None   Collection Time: 09/18/17  6:37 PM  Result Value Ref Range   Ammonia 35 9 - 35 umol/L  Blood gas, venous     Status: Abnormal   Collection Time: 09/18/17  6:37 PM  Result Value Ref Range   pH, Ven 7.33 7.250 - 7.430   pCO2, Ven 50 44.0 - 60.0 mmHg   pO2, Ven 55.0 (H) 32.0 - 45.0 mmHg   Bicarbonate 26.4 20.0 - 28.0 mmol/L   Acid-base deficit 0.3 0.0 - 2.0 mmol/L   O2 Saturation 85.8 %   Patient temperature 37.0    Collection site VEIN    Sample type VENOUS   Ethanol     Status: None   Collection Time: 09/18/17  6:37 PM  Result Value Ref Range   Alcohol, Ethyl (B) <10 <10 mg/dL  Glucose, capillary     Status: Abnormal   Collection Time: 09/18/17  8:22 PM  Result Value Ref Range   Glucose-Capillary 143 (H) 70 - 99 mg/dL  Urine Drug Screen, Qualitative ([facility] only)     Status: Abnormal   Collection Time: 09/19/17  1:35 AM  Result Value Ref Range   Tricyclic, Ur Screen POSITIVE (A) NONE DETECTED   Amphetamines, Ur Screen NONE DETECTED NONE DETECTED   MDMA (Ecstasy)Ur Screen NONE DETECTED NONE DETECTED   Cocaine Metabolite,Ur [place] NONE DETECTED NONE DETECTED   Opiate, Ur Screen NONE DETECTED NONE DETECTED   Phencyclidine (PCP) Ur S NONE DETECTED NONE DETECTED   Cannabinoid 50 Ng, Ur [place] NONE DETECTED NONE DETECTED   Barbiturates, Ur Screen NONE DETECTED NONE DETECTED   Benzodiazepine, Ur Scrn POSITIVE (A) NONE DETECTED    Methadone Scn, Ur NONE DETECTED NONE DETECTED  Urinalysis, Complete w Microscopic     Status: Abnormal   Collection Time: 09/19/17  1:35 AM  Result Value Ref Range   Color, Urine YELLOW (A) YELLOW   APPearance CLOUDY (A) CLEAR   Specific Gravity, Urine 1.021 1.005 - 1.030   pH 5.0 5.0 - 8.0   Glucose, UA 50 (A) NEGATIVE mg/dL   Hgb urine dipstick NEGATIVE NEGATIVE   Bilirubin Urine NEGATIVE NEGATIVE   Ketones, ur NEGATIVE NEGATIVE mg/dL   Protein, ur NEGATIVE NEGATIVE mg/dL   Nitrite NEGATIVE NEGATIVE   Leukocytes, UA NEGATIVE NEGATIVE   RBC / HPF 0-5 0 - 5 RBC/hpf   WBC, UA 0-5 0 - 5 WBC/hpf   Bacteria, UA NONE SEEN NONE SEEN   Squamous Epithelial / LPF 0-5 0 - 5   Mucus PRESENT    Hyaline Casts, UA PRESENT    Uric Acid Crys, UA PRESENT   Basic metabolic panel  Status: Abnormal   Collection Time: 09/19/17  3:49 AM  Result Value Ref Range   Sodium 141 135 - 145 mmol/L   Potassium 4.2 3.5 - 5.1 mmol/L   Chloride 107 98 - 111 mmol/L   CO2 30 22 - 32 mmol/L   Glucose, Bld 110 (H) 70 - 99 mg/dL   BUN 33 (H) 6 - 20 mg/dL   Creatinine, Ser 1.61 0.61 - 1.24 mg/dL   Calcium 8.5 (L) 8.9 - 10.3 mg/dL   GFR calc non Af Amer >60 >60 mL/min   GFR calc Af Amer >60 >60 mL/min   Anion gap 4 (L) 5 - 15  CBC     Status: None   Collection Time: 09/19/17  3:49 AM  Result Value Ref Range   WBC 6.9 3.8 - 10.6 K/uL   RBC 4.46 4.40 - 5.90 MIL/uL   Hemoglobin 13.6 13.0 - 18.0 g/dL   HCT 09.6 04.5 - 40.9 %   MCV 90.3 80.0 - 100.0 fL   MCH 30.6 26.0 - 34.0 pg   MCHC 33.8 32.0 - 36.0 g/dL   RDW 81.1 91.4 - 78.2 %   Platelets 263 150 - 440 K/uL  Glucose, capillary     Status: Abnormal   Collection Time: 09/19/17  7:45 AM  Result Value Ref Range   Glucose-Capillary 100 (H) 70 - 99 mg/dL   Ct Head Wo Contrast  Result Date: 09/18/2017 CLINICAL DATA:  Altered mental status with dizziness EXAM: CT HEAD WITHOUT CONTRAST TECHNIQUE: Contiguous axial images were obtained from the base of the  skull through the vertex without intravenous contrast. COMPARISON:  CT brain 11/24/2013 FINDINGS: Brain: No evidence of acute infarction, hemorrhage, hydrocephalus, extra-axial collection or mass lesion/mass effect. Vascular: No hyperdense vessel. Scattered calcification at the carotid siphon. Skull: Normal. Negative for fracture or focal lesion. Sinuses/Orbits: No acute finding. Other: None IMPRESSION: Negative non contrasted CT appearance of the brain. Electronically Signed   By: Jasmine Pang M.D.   On: 09/18/2017 18:05   Mr Brain Wo Contrast  Result Date: 09/18/2017 CLINICAL DATA:  Dizziness since yesterday. Lethargy. Ten prior episodes over last 2 years. History of hypertension, diabetes. EXAM: MRI HEAD WITHOUT CONTRAST TECHNIQUE: Multiplanar, multiecho pulse sequences of the brain and surrounding structures were obtained without intravenous contrast. COMPARISON:  CT HEAD September 18, 2017 FINDINGS: Mild motion degraded examination. INTRACRANIAL CONTENTS: No reduced diffusion to suggest acute ischemia or hyperacute demyelination. No susceptibility artifact to suggest hemorrhage. The ventricles and sulci are normal for patient's age. No suspicious parenchymal signal, masses, mass effect. No abnormal extra-axial fluid collections. No extra-axial masses. VASCULAR: Normal major intracranial vascular flow voids present at skull base. SKULL AND UPPER CERVICAL SPINE: No abnormal sellar expansion. No suspicious calvarial bone marrow signal. Craniocervical junction maintained. SINUSES/ORBITS: The mastoid air-cells and included paranasal sinuses are well-aerated.The included ocular globes and orbital contents are non-suspicious. OTHER: None. IMPRESSION: Negative motion degraded noncontrast MRI head. Electronically Signed   By: Awilda Metro M.D.   On: 09/18/2017 22:09   Dg Chest Port 1 View  Result Date: 09/18/2017 CLINICAL DATA:  Dizziness lethargy EXAM: PORTABLE CHEST 1 VIEW COMPARISON:  09/14/2014 FINDINGS:  Low lung volumes. Airspace disease at the left greater than right lung base. Borderline cardiomegaly. No effusion. No pneumothorax. IMPRESSION: Low lung volumes with left greater than right basilar atelectasis or infiltrate. Electronically Signed   By: Jasmine Pang M.D.   On: 09/18/2017 18:59     ASSESSMENT AND PLAN: Syncopal episode, MI  ruled out and EKG unremarkable. Will look at echo and do futher cardiac workup out patient.  Dennis Pineda A

## 2017-09-19 NOTE — Procedures (Signed)
ELECTROENCEPHALOGRAM REPORT   Patient: Dennis Pineda       Room #: 112A-AA EEG No. ID: [id number] Age: 57 y.o.        Sex: male Referring Physician: Pyreddy Report Date:  09/19/2017        Interpreting Physician: Thana FarrEYNOLDS, Imya Mance  History: Dennis Pineda is an 57 y.o. male with episodes of altered mental status  Medications:  Zyloprim, Lexapro, Insulin  Conditions of Recording:  This is a 21 channel routine scalp EEG performed with bipolar and monopolar montages arranged in accordance to the international 10/20 system of electrode placement. One channel was dedicated to EKG recording.  The patient is in the awake, drowsy and asleep states.  Description:  The waking background activity can be viewed briefly and consists of a low voltage, symmetrical, fairly well organized, 10 Hz alpha activity, seen from the parieto-occipital and posterior temporal regions.  Low voltage fast activity, poorly organized, is seen anteriorly and is at times superimposed on more posterior regions.  A mixture of theta and alpha rhythms are seen from the central and temporal regions. The patient drowses with slowing to irregular, low voltage theta and beta activity.   The patient goes in to a light sleep with symmetrical sleep spindles, vertex central sharp transients and irregular slow activity.  No epileptiform activity is noted.   Hyperventilation was not performed. Intermittent photic stimulation was performed but failed to illicit any change in the tracing.     IMPRESSION: Normal electroencephalogram, awake, asleep and with activation procedures. There are no focal lateralizing or epileptiform features.   Thana FarrLeslie Cyla Haluska, MD Neurology [phone] 09/19/2017, 5:48 PM

## 2017-09-20 DIAGNOSIS — R4189 Other symptoms and signs involving cognitive functions and awareness: Secondary | ICD-10-CM | POA: Diagnosis not present

## 2017-09-20 LAB — GLUCOSE, CAPILLARY: Glucose-Capillary: 161 mg/dL — ABNORMAL HIGH (ref 70–99)

## 2017-09-20 LAB — HIV ANTIBODY (ROUTINE TESTING W REFLEX): HIV Screen 4th Generation wRfx: NONREACTIVE

## 2017-09-20 NOTE — Progress Notes (Signed)
[facility] [place] at [facility]    Dennis Pineda was admitted to the Hospital on 09/18/2017 and Discharged  09/20/2017 and should be excused from work/school . He can return back to work from 8/5/2019and  may return to work/school without any restrictions.  Call Dennis AustinPavan Pyreddy MD with questions.  Dennis Pineda M.D on 09/20/2017,at 8:17 AM  [facility] [place] at [facility]    Office  [phone]

## 2017-09-20 NOTE — Progress Notes (Signed)
SUBJECTIVE: patient is feeling much better denies any chest pain or shortness of breath or dizziness   Vitals:   09/19/17 0806 09/19/17 1425 09/19/17 1944 09/20/17 0510  BP: (!) 126/97 111/78 113/78 111/84  Pulse: (!) 55 61 65 (!) 57  Resp: 20 20 17 15   Temp: 97.6 F (36.4 C) 98.1 F (36.7 C) 98 F (36.7 C) (!) 97.3 F (36.3 C)  TempSrc: Oral Oral Oral Oral  SpO2: 97% 97% 97% 96%  Weight:      Height:        Intake/Output Summary (Last 24 hours) at 09/20/2017 0940 Last data filed at 09/20/2017 0830 Gross per 24 hour  Intake 1080 ml  Output 900 ml  Net 180 ml    LABS: Basic Metabolic Panel: Recent Labs    09/18/17 1704 09/19/17 0349  NA 137 141  K 4.2 4.2  CL 102 107  CO2 24 30  GLUCOSE 233* 110*  BUN 37* 33*  CREATININE 1.06 0.96  CALCIUM 9.1 8.5*   Liver Function Tests: Recent Labs    09/18/17 1704  AST 27  ALT 21  ALKPHOS 76  BILITOT 0.5  PROT 7.4  ALBUMIN 4.2   No results for input(s): LIPASE, AMYLASE in the last 72 hours. CBC: Recent Labs    09/18/17 1704 09/19/17 0349  WBC 8.4 6.9  HGB 14.6 13.6  HCT 42.9 40.3  MCV 89.0 90.3  PLT 278 263   Cardiac Enzymes: Recent Labs    09/18/17 1704  TROPONINI <0.03   BNP: Invalid input(s): POCBNP D-Dimer: No results for input(s): DDIMER in the last 72 hours. Hemoglobin A1C: No results for input(s): HGBA1C in the last 72 hours. Fasting Lipid Panel: No results for input(s): CHOL, HDL, LDLCALC, TRIG, CHOLHDL, LDLDIRECT in the last 72 hours. Thyroid Function Tests: Recent Labs    09/18/17 1800  TSH 1.918   Anemia Panel: No results for input(s): VITAMINB12, FOLATE, FERRITIN, TIBC, IRON, RETICCTPCT in the last 72 hours.   PHYSICAL EXAM General: Well developed, well nourished, in no acute distress HEENT:  Normocephalic and atramatic Neck:  No JVD.  Lungs: Clear bilaterally to auscultation and percussion. Heart: HRRR . Normal S1 and S2 without gallops or murmurs.  Abdomen: Bowel sounds are  positive, abdomen soft and non-tender  Msk:  Back normal, normal gait. Normal strength and tone for age. Extremities: No clubbing, cyanosis or edema.   Neuro: Alert and oriented X 3. Psych:  Good affect, responds appropriately  TELEMETRY: sinus rhythm  ASSESSMENT AND PLAN: syncopal episode etiology unclear but patient was on sedatives and has been depressed. Echocardiogram was unremarkable with a normal left ventricular systolic function and mild diastolic dysfunction. Advise follow-up Thursday at 11 AM.  Principal Problem:   Decreased responsiveness Active Problems:   Diabetes mellitus without complication (HCC)   Hypertension   Depression   Syncope    Dennis Pineda A, MD, [facility] 09/20/2017 9:40 AM

## 2017-09-20 NOTE — Progress Notes (Signed)
Subjective: Patient is much improved today.  Reports that he is back to baseline.    Objective: Current vital signs: BP 111/84 (BP Location: Left Arm)   Pulse (!) 57   Temp (!) 97.3 F (36.3 C) (Oral)   Resp 15   Ht 5\' 11"  (1.803 m)   Wt 81.6 kg (179 lb 14.4 oz)   SpO2 96%   BMI 25.09 kg/m  Vital signs in last 24 hours: Temp:  [97.3 F (36.3 C)-98.1 F (36.7 C)] 97.3 F (36.3 C) (07/31 0510) Pulse Rate:  [57-65] 57 (07/31 0510) Resp:  [15-20] 15 (07/31 0510) BP: (111-113)/(78-84) 111/84 (07/31 0510) SpO2:  [96 %-97 %] 96 % (07/31 0510)  Intake/Output from previous day: 07/30 0701 - 07/31 0700 In: 720 [P.O.:720] Out: 900 [Urine:900] Intake/Output this shift: Total I/O In: 360 [P.O.:360] Out: -  Nutritional status:  Diet Order           Diet heart healthy/carb modified Room service appropriate? Yes; Fluid consistency: Thin  Diet effective now          Neurologic Exam: Mental Status: Alert and oriented.  Speech fluent.  Follows 3-step commands without difficulty.   Cranial Nerves: II: Discs flat bilaterally; Visual fields grossly normal, pupils equal, round, reactive to light and accommodation III,IV, VI: ptosis not present, extra-ocular motions intact bilaterally V,VII: smile symmetric, facial light touch sensation normal bilaterally VIII: hearing normal bilaterally IX,X: gag reflex present XI: bilateral shoulder shrug XII: midline tongue extension Motor: 5/ 5throughout Sensory: Pinprick and light touch intact throughout, bilaterally   Lab Results: Basic Metabolic Panel: Recent Labs  Lab 09/18/17 1704 09/19/17 0349  NA 137 141  K 4.2 4.2  CL 102 107  CO2 24 30  GLUCOSE 233* 110*  BUN 37* 33*  CREATININE 1.06 0.96  CALCIUM 9.1 8.5*    Liver Function Tests: Recent Labs  Lab 09/18/17 1704  AST 27  ALT 21  ALKPHOS 76  BILITOT 0.5  PROT 7.4  ALBUMIN 4.2   No results for input(s): LIPASE, AMYLASE in the last 168 hours. Recent Labs  Lab  09/18/17 1837  AMMONIA 35    CBC: Recent Labs  Lab 09/18/17 1704 09/19/17 0349  WBC 8.4 6.9  HGB 14.6 13.6  HCT 42.9 40.3  MCV 89.0 90.3  PLT 278 263    Cardiac Enzymes: Recent Labs  Lab 09/18/17 1704  TROPONINI <0.03    Lipid Panel: No results for input(s): CHOL, TRIG, HDL, CHOLHDL, VLDL, LDLCALC in the last 168 hours.  CBG: Recent Labs  Lab 09/19/17 0745 09/19/17 1134 09/19/17 1748 09/19/17 2132 09/20/17 0743  GLUCAP 100* 151* 126* 122* 161*    Microbiology: Results for orders placed or performed during the hospital encounter of 09/14/14  MRSA PCR Screening     Status: None   Collection Time: 09/14/14 10:54 AM  Result Value Ref Range Status   MRSA by PCR NEGATIVE NEGATIVE Final    Comment:        The GeneXpert MRSA Assay (FDA approved for NASAL specimens only), is one component of a comprehensive MRSA colonization surveillance program. It is not intended to diagnose MRSA infection nor to guide or monitor treatment for MRSA infections.     Coagulation Studies: No results for input(s): LABPROT, INR in the last 72 hours.  Imaging: Ct Head Wo Contrast  Result Date: 09/18/2017 CLINICAL DATA:  Altered mental status with dizziness EXAM: CT HEAD WITHOUT CONTRAST TECHNIQUE: Contiguous axial images were obtained from the base of  the skull through the vertex without intravenous contrast. COMPARISON:  CT brain 11/24/2013 FINDINGS: Brain: No evidence of acute infarction, hemorrhage, hydrocephalus, extra-axial collection or mass lesion/mass effect. Vascular: No hyperdense vessel. Scattered calcification at the carotid siphon. Skull: Normal. Negative for fracture or focal lesion. Sinuses/Orbits: No acute finding. Other: None IMPRESSION: Negative non contrasted CT appearance of the brain. Electronically Signed   By: Jasmine Pang M.D.   On: 09/18/2017 18:05   Mr Brain Wo Contrast  Result Date: 09/18/2017 CLINICAL DATA:  Dizziness since yesterday. Lethargy. Ten  prior episodes over last 2 years. History of hypertension, diabetes. EXAM: MRI HEAD WITHOUT CONTRAST TECHNIQUE: Multiplanar, multiecho pulse sequences of the brain and surrounding structures were obtained without intravenous contrast. COMPARISON:  CT HEAD September 18, 2017 FINDINGS: Mild motion degraded examination. INTRACRANIAL CONTENTS: No reduced diffusion to suggest acute ischemia or hyperacute demyelination. No susceptibility artifact to suggest hemorrhage. The ventricles and sulci are normal for patient's age. No suspicious parenchymal signal, masses, mass effect. No abnormal extra-axial fluid collections. No extra-axial masses. VASCULAR: Normal major intracranial vascular flow voids present at skull base. SKULL AND UPPER CERVICAL SPINE: No abnormal sellar expansion. No suspicious calvarial bone marrow signal. Craniocervical junction maintained. SINUSES/ORBITS: The mastoid air-cells and included paranasal sinuses are well-aerated.The included ocular globes and orbital contents are non-suspicious. OTHER: None. IMPRESSION: Negative motion degraded noncontrast MRI head. Electronically Signed   By: Awilda Metro M.D.   On: 09/18/2017 22:09   Dg Chest Port 1 View  Result Date: 09/18/2017 CLINICAL DATA:  Dizziness lethargy EXAM: PORTABLE CHEST 1 VIEW COMPARISON:  09/14/2014 FINDINGS: Low lung volumes. Airspace disease at the left greater than right lung base. Borderline cardiomegaly. No effusion. No pneumothorax. IMPRESSION: Low lung volumes with left greater than right basilar atelectasis or infiltrate. Electronically Signed   By: Jasmine Pang M.D.   On: 09/18/2017 18:59    Medications:  I have reviewed the patient's current medications. Scheduled: . allopurinol  100 mg Oral Daily  . enoxaparin (LOVENOX) injection  40 mg Subcutaneous Q24H  . escitalopram  20 mg Oral Daily  . insulin aspart  0-5 Units Subcutaneous QHS  . insulin aspart  0-9 Units Subcutaneous TID WC    Assessment/Plan: Patient  back to baseline.  Concern remains that etiology is related to medications.  EEG shows no epileptiform activity.   Patient to follow up on an outpatient basis.   LOS: 0 days   Thana Farr, MD Neurology [phone] 09/20/2017  11:54 AM

## 2017-09-20 NOTE — Progress Notes (Signed)
Patient discharged home with family, discharge education provided to patient. Patient verbalized understanding, all questions answered.

## 2017-09-20 NOTE — Progress Notes (Signed)
Advanced care plan. Purpose of the Encounter: CODE STATUS Parties in Attendance: Patient and family Patient's Decision Capacity: Good Subjective/Patient's story: Presented initially for somnolence and lethargy Objective/Medical story Has polypharmacy and excessive medication use  worked up for stroke Goals of care determination:  Advance care directives and goals of care discussed with patient and family They want everything done for now which includes cpr and intubation if need arises CODE STATUS: Full code Time spent discussing advanced care planning: 16 minutes

## 2017-09-20 NOTE — Plan of Care (Signed)

## 2017-09-20 NOTE — Discharge Summary (Signed)
[facility] - [place] at [facility]   PATIENT NAME: Dennis Pineda    MR#:  [id number]  DATE OF BIRTH:  Dec 10, 1960  DATE OF ADMISSION:  09/18/2017 ADMITTING PHYSICIAN: Oralia Manis, MD  DATE OF DISCHARGE: 09/20/2017 12:16 PM  PRIMARY CARE PHYSICIAN: Patient, No Pcp Per   ADMISSION DIAGNOSIS:  Somnolence [R40.0] Confusion [R41.0] Altered mental status, unspecified altered mental status type [R41.82]  DISCHARGE DIAGNOSIS:  Principal Problem:   Decreased responsiveness Active Problems:   Diabetes mellitus without complication (HCC)   Hypertension   Depression   Syncope Polypharmacy Altered mental status secondary to medication  SECONDARY DIAGNOSIS:   Past Medical History:  Diagnosis Date  . Arthritis    gout  . Depression   . Diabetes mellitus without complication (HCC)   . Hypertension      ADMITTING HISTORY Dennis Pineda  is a 57 y.o. male who presents with chief complaint as above.  She has had some confusion and decreased responsiveness since yesterday afternoon.  Wife states he has been intermittently confused, very drowsy and lethargic.  She states that he has a prescription for muscle relaxants but has not taken any for the past 24 hours.  He has a prior history of alcohol and narcotic use, but he is currently on Suboxone and she states that he has not been drinking alcohol recently.  He has had similar symptoms intermittently over the past year or so, but she states that he would typically come back around after about 12 hours of sleeping.  No overt seizure activity noted.  Patient remained lethargic here in the ED, when this writer went and interviewed the patient he woke up and was able to speak some, but could not recall details of why he was in the ED.  He did state that he had had several syncopal episodes this past week while working his job, which is [employer].  Hospitalist were asked to admit the patient for further evaluation    HOSPITAL  COURSE:  Patient was admitted to medical floor.  He was worked up with CT head, MRI brain.  MRI brain did not show any acute abnormality.  Patient is on Suboxone and multiple medications at home which are causing him more somnolence and lethargy.  He was worked up with EEG and seen by  neurology at the hospital.  EEG was normal.  Patient had echocardiogram which was normal and was seen by cardiology.  Troponins were negative.  No cardiac etiology for syncope.  Syncope work-up was negative. patient has been advised to cut down the number of medications he takes and also advised to stop using Suboxone.  Patient hemodynamically stable will be discharged home.  CONSULTS OBTAINED:  Treatment Team:  Laurier Nancy, MD Thana Farr, MD  DRUG ALLERGIES:  No Known Allergies  DISCHARGE MEDICATIONS:   Allergies as of 09/20/2017   No Known Allergies     Medication List    STOP taking these medications   colchicine 0.6 MG tablet   cyclobenzaprine 10 MG tablet Commonly known as:  FLEXERIL   meloxicam 15 MG tablet Commonly known as:  MOBIC   methylPREDNISolone 4 MG Tbpk tablet Commonly known as:  MEDROL DOSEPAK   SUBOXONE 8-2 MG Film Generic drug:  Buprenorphine HCl-Naloxone HCl     TAKE these medications   allopurinol 100 MG tablet Commonly known as:  ZYLOPRIM Take 1 tablet (100 mg total) by mouth daily.   CLEAR EYES OP Place 2 drops into both eyes  daily as needed (for red eyes).   escitalopram 20 MG tablet Commonly known as:  LEXAPRO Take 20 mg by mouth daily.   naproxen 500 MG tablet Commonly known as:  NAPROSYN Take 1 tablet (500 mg total) by mouth 2 (two) times daily with a meal.   sertraline 100 MG tablet Commonly known as:  ZOLOFT Take 1 tablet (100 mg total) by mouth daily.       Today  Patient seen and evaluated today Mental status back to baseline Awake and alert and responds to all verbal commands Will be discharged home with outpatient neurology  appointment  VITAL SIGNS:  Blood pressure 111/84, pulse (!) 57, temperature (!) 97.3 F (36.3 C), temperature source Oral, resp. rate 15, height 5\' 11"  (1.803 m), weight 179 lb 14.4 oz (81.6 kg), SpO2 96 %.  I/O:    Intake/Output Summary (Last 24 hours) at 09/20/2017 1513 Last data filed at 09/20/2017 0830 Gross per 24 hour  Intake 600 ml  Output 900 ml  Net -300 ml    PHYSICAL EXAMINATION:  Physical Exam  GENERAL:  57 y.o.-year-old patient lying in the bed with no acute distress.  LUNGS: Normal breath sounds bilaterally, no wheezing, rales,rhonchi or crepitation. No use of accessory muscles of respiration.  CARDIOVASCULAR: S1, S2 normal. No murmurs, rubs, or gallops.  ABDOMEN: Soft, non-tender, non-distended. Bowel sounds present. No organomegaly or mass.  NEUROLOGIC: Moves all 4 extremities. PSYCHIATRIC: The patient is alert and oriented x 3.  SKIN: No obvious rash, lesion, or ulcer.   DATA REVIEW:   CBC Recent Labs  Lab 09/19/17 0349  WBC 6.9  HGB 13.6  HCT 40.3  PLT 263    Chemistries  Recent Labs  Lab 09/18/17 1704 09/19/17 0349  NA 137 141  K 4.2 4.2  CL 102 107  CO2 24 30  GLUCOSE 233* 110*  BUN 37* 33*  CREATININE 1.06 0.96  CALCIUM 9.1 8.5*  AST 27  --   ALT 21  --   ALKPHOS 76  --   BILITOT 0.5  --     Cardiac Enzymes Recent Labs  Lab 09/18/17 1704  TROPONINI <0.03    Microbiology Results  Results for orders placed or performed during the hospital encounter of 09/14/14  MRSA PCR Screening     Status: None   Collection Time: 09/14/14 10:54 AM  Result Value Ref Range Status   MRSA by PCR NEGATIVE NEGATIVE Final    Comment:        The GeneXpert MRSA Assay (FDA approved for NASAL specimens only), is one component of a comprehensive MRSA colonization surveillance program. It is not intended to diagnose MRSA infection nor to guide or monitor treatment for MRSA infections.     RADIOLOGY:  Ct Head Wo Contrast  Result Date:  09/18/2017 CLINICAL DATA:  Altered mental status with dizziness EXAM: CT HEAD WITHOUT CONTRAST TECHNIQUE: Contiguous axial images were obtained from the base of the skull through the vertex without intravenous contrast. COMPARISON:  CT brain 11/24/2013 FINDINGS: Brain: No evidence of acute infarction, hemorrhage, hydrocephalus, extra-axial collection or mass lesion/mass effect. Vascular: No hyperdense vessel. Scattered calcification at the carotid siphon. Skull: Normal. Negative for fracture or focal lesion. Sinuses/Orbits: No acute finding. Other: None IMPRESSION: Negative non contrasted CT appearance of the brain. Electronically Signed   By: Jasmine Pang M.D.   On: 09/18/2017 18:05   Mr Brain Wo Contrast  Result Date: 09/18/2017 CLINICAL DATA:  Dizziness since yesterday. Lethargy. Ten prior episodes  over last 2 years. History of hypertension, diabetes. EXAM: MRI HEAD WITHOUT CONTRAST TECHNIQUE: Multiplanar, multiecho pulse sequences of the brain and surrounding structures were obtained without intravenous contrast. COMPARISON:  CT HEAD September 18, 2017 FINDINGS: Mild motion degraded examination. INTRACRANIAL CONTENTS: No reduced diffusion to suggest acute ischemia or hyperacute demyelination. No susceptibility artifact to suggest hemorrhage. The ventricles and sulci are normal for patient's age. No suspicious parenchymal signal, masses, mass effect. No abnormal extra-axial fluid collections. No extra-axial masses. VASCULAR: Normal major intracranial vascular flow voids present at skull base. SKULL AND UPPER CERVICAL SPINE: No abnormal sellar expansion. No suspicious calvarial bone marrow signal. Craniocervical junction maintained. SINUSES/ORBITS: The mastoid air-cells and included paranasal sinuses are well-aerated.The included ocular globes and orbital contents are non-suspicious. OTHER: None. IMPRESSION: Negative motion degraded noncontrast MRI head. Electronically Signed   By: Awilda Metroourtnay  Bloomer M.D.   On:  09/18/2017 22:09   Dg Chest Port 1 View  Result Date: 09/18/2017 CLINICAL DATA:  Dizziness lethargy EXAM: PORTABLE CHEST 1 VIEW COMPARISON:  09/14/2014 FINDINGS: Low lung volumes. Airspace disease at the left greater than right lung base. Borderline cardiomegaly. No effusion. No pneumothorax. IMPRESSION: Low lung volumes with left greater than right basilar atelectasis or infiltrate. Electronically Signed   By: Jasmine PangKim  Fujinaga M.D.   On: 09/18/2017 18:59    Follow up with PCP in 1 week.  Management plans discussed with the patient, family and they are in agreement.  CODE STATUS: FULL CODE    Code Status Orders  (From admission, onward)        Start     Ordered   09/19/17 0236  Full code  Continuous     09/19/17 0235    Code Status History    Date Active Date Inactive Code Status Order ID Comments User Context   09/14/2014 1041 09/15/2014 1726 Full Code [id number]  Houston SirenSainani, Vivek J, MD Inpatient    Advance Directive Documentation     Most Recent Value  Type of Advance Directive  Healthcare Power of Attorney  Pre-existing out of facility DNR order (yellow form or pink MOST form)  -  "MOST" Form in Place?  -      TOTAL TIME TAKING CARE OF THIS PATIENT ON DAY OF DISCHARGE: more than 35 minutes.   Ihor AustinPavan Dietrich Ke M.D on 09/20/2017 at 3:13 PM  Between 7am to 6pm - Pager - [phone]  After 6pm go to [email or web address] - password [email or web address]  [facility]  Office  [phone]  CC: Primary care physician; Patient, No Pcp Per  Note: This dictation was prepared with Dragon dictation along with smaller phrase technology. Any transcriptional errors that result from this process are unintentional.

## 2017-09-26 ENCOUNTER — Emergency Department
Admission: EM | Admit: 2017-09-26 | Discharge: 2017-09-26 | Disposition: A | Discharge status: Home / Self Care | Payer: BLUE CROSS/BLUE SHIELD | Attending: Emergency Medicine | Admitting: Emergency Medicine

## 2017-09-26 ENCOUNTER — Encounter: Payer: Self-pay | Admitting: Emergency Medicine

## 2017-09-26 DIAGNOSIS — M5136 Other intervertebral disc degeneration, lumbar region: Secondary | ICD-10-CM

## 2017-09-26 DIAGNOSIS — M545 Low back pain, unspecified: Secondary | ICD-10-CM

## 2017-09-26 DIAGNOSIS — E119 Type 2 diabetes mellitus without complications: Secondary | ICD-10-CM | POA: Diagnosis not present

## 2017-09-26 DIAGNOSIS — Z79899 Other long term (current) drug therapy: Secondary | ICD-10-CM | POA: Insufficient documentation

## 2017-09-26 DIAGNOSIS — M5137 Other intervertebral disc degeneration, lumbosacral region: Secondary | ICD-10-CM

## 2017-09-26 DIAGNOSIS — Z87891 Personal history of nicotine dependence: Secondary | ICD-10-CM | POA: Insufficient documentation

## 2017-09-26 DIAGNOSIS — I10 Essential (primary) hypertension: Secondary | ICD-10-CM | POA: Diagnosis not present

## 2017-09-26 MED ORDER — PREDNISONE 10 MG PO TABS: 10.0000 mg | ORAL_TABLET | Freq: Every day | ORAL | 0 refills | Status: DC

## 2017-09-26 MED ORDER — MELOXICAM 15 MG PO TABS: 15.0000 mg | ORAL_TABLET | Freq: Every day | ORAL | 0 refills | Status: DC

## 2017-09-26 MED ORDER — METHOCARBAMOL 500 MG PO TABS: 500.0000 mg | ORAL_TABLET | Freq: Every day | ORAL | 0 refills | Status: AC

## 2017-09-26 NOTE — ED Notes (Signed)
See triage note  Presents with right lower back pain  States pain has been there since 7/24  Pain does not move into leg but radiates into groin area  States pain gets worse with movement  Denies any n/v/d ,fever,trauma or urinary sx's  States he thinks that it might be a hernia and would like to have a CT scan  Ambulates well to treatment room

## 2017-09-26 NOTE — ED Triage Notes (Signed)
Pt reports was recently seen for low back pain and it is still hurting. Pt requesting CT scan or MRI to see what is wrong.

## 2017-09-26 NOTE — ED Provider Notes (Signed)
[facility] Emergency Department Provider Note  ____________________________________________  Time seen: Approximately 3:24 PM  I have reviewed the triage vital signs and the nursing notes.   HISTORY  Chief Complaint Back Pain    HPI Dennis Pineda is a 57 y.o. male who presents emergency department complaining of lower back pain radiating into his groin.  Patient reports that injury initially occurred while at work.  Patient was trying to lift a bucket of bolts that weight approximately 100 pounds.  Patient reports that he was attempting to lift heavy item when he felt a sharp pain/pop sensation in his lower back.  Patient reports that he immediately sat down the item, but did not immediately experience other symptoms.  The next day, patient had increasing lower back pain with pain radiating into his right groin.  Patient was initially evaluated in this department with an x-ray with no acute fractures, no indication of hernia.    Past Medical History:  Diagnosis Date  . Arthritis    gout  . Depression   . Diabetes mellitus without complication (HCC)   . Hypertension     Patient Active Problem List   Diagnosis Date Noted  . Syncope 09/19/2017  . Decreased responsiveness 09/18/2017  . Diabetes mellitus without complication (HCC) 05/10/2015  . Hypertension 05/10/2015  . Depression 05/10/2015  . Fracture of multiple ribs 05/01/2015  . Angioedema 09/14/2014    Past Surgical History:  Procedure Laterality Date  . APPENDECTOMY    . KNEE SURGERY    . ROTATOR CUFF REPAIR    . SHOULDER SURGERY      Prior to Admission medications   Medication Sig Start Date End Date Taking? Authorizing Provider  buprenorphine-naloxone (SUBOXONE) 8-2 mg SUBL SL tablet Place 1 tablet under the tongue daily.   Yes [provider]  cyclobenzaprine (FLEXERIL) 10 MG tablet Take 10 mg by mouth 3 (three) times daily as needed for muscle spasms.   Yes [provider]  allopurinol (ZYLOPRIM) 100 MG tablet Take 1 tablet (100 mg total) by mouth daily. 07/18/17 09/16/17  Menshew, Charlesetta Ivory, PA-C  escitalopram (LEXAPRO) 20 MG tablet Take 20 mg by mouth daily. 09/07/17   [provider]  meloxicam (MOBIC) 15 MG tablet Take 1 tablet (15 mg total) by mouth daily. 09/26/17   Cuthriell, Delorise Royals, PA-C  methocarbamol (ROBAXIN) 500 MG tablet Take 1 tablet (500 mg total) by mouth at bedtime. 09/26/17   Cuthriell, Delorise Royals, PA-C  Naphazoline HCl (CLEAR EYES OP) Place 2 drops into both eyes daily as needed (for red eyes).    [provider]  naproxen (NAPROSYN) 500 MG tablet Take 1 tablet (500 mg total) by mouth 2 (two) times daily with a meal. 11/24/13   Harris, Abigail, PA-C  predniSONE (DELTASONE) 10 MG tablet Take 1 tablet (10 mg total) by mouth daily. 09/26/17   Cuthriell, Delorise Royals, PA-C  sertraline (ZOLOFT) 100 MG tablet Take 1 tablet (100 mg total) by mouth daily. 10/29/13   Le, Thao P, DO    Allergies Patient has no known allergies.  Family History  Problem Relation Age of Onset  . Diabetes Mother   . Cancer Father        Liver  . Diabetes Father   . Diabetes Sister   . Diabetes Maternal Grandmother     Social History Social History   Tobacco Use  . Smoking status: Former Smoker    Types: Cigarettes  . Smokeless tobacco: Never Used  Substance Use Topics  . Alcohol use: No    Alcohol/week: 0.0 oz  . Drug use: No     Review of Systems  Constitutional: No fever/chills Eyes: No visual changes. No discharge ENT: No upper respiratory complaints. Cardiovascular: no chest pain. Respiratory: no cough. No SOB. Gastrointestinal: No abdominal pain.  No nausea, no vomiting.  No diarrhea.  No constipation. Genitourinary: Negative for dysuria. No hematuria Musculoskeletal: Sharp midline and right-sided lower back pain radiating into the right groin. Skin: Negative for rash, abrasions, lacerations,  ecchymosis. Neurological: Negative for headaches, focal weakness or numbness. 10-point ROS otherwise negative.  ____________________________________________   PHYSICAL EXAM:  VITAL SIGNS: ED Triage Vitals  Enc Vitals Group     BP 09/26/17 1459 (!) 144/92     Pulse Rate 09/26/17 1459 81     Resp --      Temp 09/26/17 1459 98.5 F (36.9 C)     Temp Source 09/26/17 1459 Oral     SpO2 09/26/17 1459 97 %     Weight 09/26/17 1458 18 lb (8.165 kg)     Height 09/26/17 1458 5\' 11"  (1.803 m)     Head Circumference --      Peak Flow --      Pain Score 09/26/17 1458 6     Pain Loc --      Pain Edu? --      Excl. in GC? --      Constitutional: Alert and oriented. Well appearing and in no acute distress. Eyes: Conjunctivae are normal. PERRL. EOMI. Head: Atraumatic. ENT:      Ears:       Nose: No congestion/rhinnorhea.      Mouth/Throat: Mucous membranes are moist.  Neck: No stridor.    Cardiovascular: Normal rate, regular rhythm. Normal S1 and S2.  Good peripheral circulation. Respiratory: Normal respiratory effort without tachypnea or retractions. Lungs CTAB. Good air entry to the bases with no decreased or absent breath sounds. Gastrointestinal: Bowel sounds 4 quadrants. Soft and nontender to palpation. No guarding or rigidity. No palpable masses. No distention. No CVA tenderness. Genitourinary: Visualization of the genitals reveals no deformity.  Palpation along the inguinal canal reveals no appreciable hernia.  Palpation of the testicles reveals no palpable abnormality. Musculoskeletal: Full range of motion to all extremities. No gross deformities appreciated.  Visualization of the lower back reveals no deformity or abnormality.  Patient is very tender to palpation over the L3-L4 area as well as the L5-S1 area.  No palpable abnormality or step-off.  Patient has mild tenderness to palpation right paraspinal muscle group in this region as well.  No tenderness to palpation of bilateral  sciatic notches.  Negative straight leg raise bilaterally.  Dorsalis pedis pulse intact bilateral lower extremities.  Sensation intact and equal bilateral lower extremities. Neurologic:  Normal speech and language. No gross focal neurologic deficits are appreciated.  Skin:  Skin is warm, dry and intact. No rash noted. Psychiatric: Mood and affect are normal. Speech and behavior are normal. Patient exhibits appropriate insight and judgement.   ____________________________________________   LABS (all labs ordered are listed, but only abnormal results are displayed)  Labs Reviewed - No data to display ____________________________________________  EKG   ____________________________________________  RADIOLOGY  L-spine x-ray from 09/13/2017 was reviewed.  No acute osseous abnormality.  Moderate degenerative disc disease in the lumbar spine.  Significant degeneration in the L5-S1.  Ultrasound from 09/13/2017 was reviewed.  No indication of hernia.  No results found.  ____________________________________________  PROCEDURES  Procedure(s) performed:    Procedures    Medications - No data to display   ____________________________________________   INITIAL IMPRESSION / ASSESSMENT AND PLAN / ED COURSE  Pertinent labs & imaging results that were available during my care of the patient were reviewed by me and considered in my medical decision making (see chart for details).  Review of the [place] CSRS was performed in accordance of the [facility] prior to dispensing any controlled drugs.      Patient's diagnosis is consistent with acute low back pain with severe degeneration of the intervertebral disc at the L5-S1 level.  Patient presents with ongoing back pain after injury at work.  Patient was lifting an item, felt a sudden sharp pain in his lower back.  Patient is having pain radiating from his lower back into his groin.  No indication of hernia.  This pain is been ongoing x3 weeks,  no indication of torsion.  No further imaging as previous imaging was reviewed.  I discussed at length likely cause of pain to include aggravation of S1 nerve roots.  At this time, patient will be placed on meloxicam, Robaxin, steroid taper.  He is to follow-up with neurosurgery for further discussion of both chronic degeneration as well as acute worsening of pain..  Patient given work restrictions until he can see neurosurgery.  Patient is given ED precautions to return to the ED for any worsening or new symptoms.     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 Despite best efforts to proofread, errors can occur which can change the meaning. Any change was purely unintentional.    Racheal PatchesCuthriell, Jonathan D, PA-C 09/26/17 1827    Minna AntisPaduchowski, Kevin, MD 09/26/17 2203

## 2017-11-08 ENCOUNTER — Inpatient Hospital Stay: Payer: BLUE CROSS/BLUE SHIELD

## 2017-11-08 ENCOUNTER — Inpatient Hospital Stay
Admission: EM | Admit: 2017-11-08 | Discharge: 2017-11-11 | DRG: 948 | Disposition: A | Discharge status: Home / Self Care | Payer: BLUE CROSS/BLUE SHIELD | Attending: Internal Medicine | Admitting: Internal Medicine

## 2017-11-08 ENCOUNTER — Encounter: Payer: Self-pay | Admitting: Emergency Medicine

## 2017-11-08 ENCOUNTER — Other Ambulatory Visit: Payer: Self-pay

## 2017-11-08 DIAGNOSIS — R81 Glycosuria: Secondary | ICD-10-CM | POA: Diagnosis present

## 2017-11-08 DIAGNOSIS — R441 Visual hallucinations: Secondary | ICD-10-CM | POA: Diagnosis present

## 2017-11-08 DIAGNOSIS — I10 Essential (primary) hypertension: Secondary | ICD-10-CM | POA: Diagnosis present

## 2017-11-08 DIAGNOSIS — Z7952 Long term (current) use of systemic steroids: Secondary | ICD-10-CM | POA: Diagnosis not present

## 2017-11-08 DIAGNOSIS — E871 Hypo-osmolality and hyponatremia: Secondary | ICD-10-CM | POA: Diagnosis present

## 2017-11-08 DIAGNOSIS — Z9189 Other specified personal risk factors, not elsewhere classified: Secondary | ICD-10-CM

## 2017-11-08 DIAGNOSIS — E878 Other disorders of electrolyte and fluid balance, not elsewhere classified: Secondary | ICD-10-CM | POA: Diagnosis present

## 2017-11-08 DIAGNOSIS — T50905A Adverse effect of unspecified drugs, medicaments and biological substances, initial encounter: Secondary | ICD-10-CM | POA: Diagnosis present

## 2017-11-08 DIAGNOSIS — E1165 Type 2 diabetes mellitus with hyperglycemia: Secondary | ICD-10-CM | POA: Diagnosis present

## 2017-11-08 DIAGNOSIS — G4752 REM sleep behavior disorder: Secondary | ICD-10-CM | POA: Diagnosis present

## 2017-11-08 DIAGNOSIS — M109 Gout, unspecified: Secondary | ICD-10-CM | POA: Diagnosis present

## 2017-11-08 DIAGNOSIS — M1990 Unspecified osteoarthritis, unspecified site: Secondary | ICD-10-CM | POA: Diagnosis present

## 2017-11-08 DIAGNOSIS — F329 Major depressive disorder, single episode, unspecified: Secondary | ICD-10-CM | POA: Diagnosis present

## 2017-11-08 DIAGNOSIS — Z79899 Other long term (current) drug therapy: Secondary | ICD-10-CM | POA: Diagnosis not present

## 2017-11-08 DIAGNOSIS — R443 Hallucinations, unspecified: Secondary | ICD-10-CM | POA: Diagnosis present

## 2017-11-08 DIAGNOSIS — R4182 Altered mental status, unspecified: Secondary | ICD-10-CM | POA: Diagnosis present

## 2017-11-08 DIAGNOSIS — Z833 Family history of diabetes mellitus: Secondary | ICD-10-CM | POA: Diagnosis not present

## 2017-11-08 DIAGNOSIS — G894 Chronic pain syndrome: Secondary | ICD-10-CM | POA: Diagnosis present

## 2017-11-08 DIAGNOSIS — E860 Dehydration: Secondary | ICD-10-CM | POA: Diagnosis present

## 2017-11-08 DIAGNOSIS — Z87891 Personal history of nicotine dependence: Secondary | ICD-10-CM | POA: Diagnosis not present

## 2017-11-08 LAB — COMPREHENSIVE METABOLIC PANEL
ALK PHOS: 93 U/L (ref 38–126)
ALT: 19 U/L (ref 0–44)
AST: 24 U/L (ref 15–41)
Albumin: 4.3 g/dL (ref 3.5–5.0)
Anion gap: 12 (ref 5–15)
BILIRUBIN TOTAL: 0.6 mg/dL (ref 0.3–1.2)
BUN: 25 mg/dL — ABNORMAL HIGH (ref 6–20)
CALCIUM: 9.7 mg/dL (ref 8.9–10.3)
CO2: 24 mmol/L (ref 22–32)
Chloride: 96 mmol/L — ABNORMAL LOW (ref 98–111)
Creatinine, Ser: 0.94 mg/dL (ref 0.61–1.24)
GFR calc non Af Amer: >60 mL/min (ref >60)
Glucose, Bld: 351 mg/dL — ABNORMAL HIGH (ref 70–99)
Potassium: 4.5 mmol/L (ref 3.5–5.1)
SODIUM: 132 mmol/L — AB (ref 135–145)
TOTAL PROTEIN: 7.7 g/dL (ref 6.5–8.1)

## 2017-11-08 LAB — URINE DRUG SCREEN, QUALITATIVE (ARMC ONLY)
AMPHETAMINES, UR SCREEN: NOT DETECTED
BENZODIAZEPINE, UR SCRN: NOT DETECTED
Barbiturates, Ur Screen: NOT DETECTED
Cannabinoid 50 Ng, Ur ~~LOC~~: NOT DETECTED
Cocaine Metabolite,Ur ~~LOC~~: NOT DETECTED
MDMA (Ecstasy)Ur Screen: NOT DETECTED
METHADONE SCREEN, URINE: NOT DETECTED
Opiate, Ur Screen: NOT DETECTED
Phencyclidine (PCP) Ur S: NOT DETECTED
TRICYCLIC, UR SCREEN: DETECTED — AB

## 2017-11-08 LAB — RAPID HIV SCREEN (HIV 1/2 AB+AG)
HIV 1/2 ANTIBODIES: NONREACTIVE
HIV-1 P24 ANTIGEN - HIV24: NONREACTIVE

## 2017-11-08 LAB — URINALYSIS, COMPLETE (UACMP) WITH MICROSCOPIC
BACTERIA UA: NONE SEEN
Bilirubin Urine: NEGATIVE
Glucose, UA: >=500 mg/dL — AB
Hgb urine dipstick: NEGATIVE
KETONES UR: NEGATIVE mg/dL
Leukocytes, UA: NEGATIVE
NITRITE: NEGATIVE
PH: 5 (ref 5.0–8.0)
Protein, ur: NEGATIVE mg/dL
SQUAMOUS EPITHELIAL / LPF: NONE SEEN (ref 0–5)
Specific Gravity, Urine: 1.014 (ref 1.005–1.030)

## 2017-11-08 LAB — TSH: TSH: 1.573 u[IU]/mL (ref 0.350–4.500)

## 2017-11-08 LAB — SALICYLATE LEVEL

## 2017-11-08 LAB — GLUCOSE, CAPILLARY
GLUCOSE-CAPILLARY: 247 mg/dL — AB (ref 70–99)
Glucose-Capillary: 342 mg/dL — ABNORMAL HIGH (ref 70–99)
Glucose-Capillary: 342 mg/dL — ABNORMAL HIGH (ref 70–99)
Glucose-Capillary: 108 mg/dL — ABNORMAL HIGH (ref 70–99)
Glucose-Capillary: 131 mg/dL — ABNORMAL HIGH (ref 70–99)
Glucose-Capillary: 176 mg/dL — ABNORMAL HIGH (ref 70–99)

## 2017-11-08 LAB — VITAMIN B12: VITAMIN B 12: 580 pg/mL (ref 180–914)

## 2017-11-08 LAB — CBC
HCT: 40.2 % (ref 40.0–52.0)
HEMOGLOBIN: 14 g/dL (ref 13.0–18.0)
MCH: 30.4 pg (ref 26.0–34.0)
MCHC: 34.8 g/dL (ref 32.0–36.0)
MCV: 87.3 fL (ref 80.0–100.0)
Platelets: 279 10*3/uL (ref 150–440)
RBC: 4.6 MIL/uL (ref 4.40–5.90)
RDW: 13.4 % (ref 11.5–14.5)
WBC: 6.1 10*3/uL (ref 3.8–10.6)

## 2017-11-08 LAB — ACETAMINOPHEN LEVEL: Acetaminophen (Tylenol), Serum: <10 ug/mL — ABNORMAL LOW (ref 10–30)

## 2017-11-08 LAB — AMMONIA: AMMONIA: 24 umol/L (ref 9–35)

## 2017-11-08 LAB — FOLATE: Folate: 39 ng/mL (ref >5.9)

## 2017-11-08 MED ORDER — SODIUM CHLORIDE 0.9 % IV BOLUS
1000.0000 mL | Freq: Once | INTRAVENOUS | Status: AC
  Administered 2017-11-08: 1000 mL via INTRAVENOUS

## 2017-11-08 MED ORDER — ONDANSETRON HCL 4 MG/2ML IJ SOLN: 4.0000 mg | Freq: Four times a day (QID) | INTRAMUSCULAR | Status: DC | PRN

## 2017-11-08 MED ORDER — HALOPERIDOL LACTATE 5 MG/ML IJ SOLN
10.0000 mg | Freq: Once | INTRAMUSCULAR | Status: AC
  Administered 2017-11-08: 10 mg via INTRAMUSCULAR

## 2017-11-08 MED ORDER — BUPRENORPHINE HCL-NALOXONE HCL 8-2 MG SL SUBL: 1.0000 | SUBLINGUAL_TABLET | Freq: Every day | SUBLINGUAL | Status: DC

## 2017-11-08 MED ORDER — HALOPERIDOL LACTATE 5 MG/ML IJ SOLN
INTRAMUSCULAR | Status: AC
  Filled 2017-11-08: qty 2

## 2017-11-08 MED ORDER — PIPERACILLIN-TAZOBACTAM 3.375 G IVPB
3.3750 g | Freq: Three times a day (TID) | INTRAVENOUS | Status: DC
  Administered 2017-11-08 – 2017-11-10 (×5): 3.375 g via INTRAVENOUS
  Filled 2017-11-08 (×5): qty 50

## 2017-11-08 MED ORDER — LORAZEPAM 2 MG/ML IJ SOLN
INTRAMUSCULAR | Status: AC
  Filled 2017-11-08: qty 1

## 2017-11-08 MED ORDER — SODIUM CHLORIDE 0.9 % IV SOLN
INTRAVENOUS | Status: DC
  Administered 2017-11-08 (×2): via INTRAVENOUS

## 2017-11-08 MED ORDER — DIPHENHYDRAMINE HCL 50 MG/ML IJ SOLN
INTRAMUSCULAR | Status: AC
  Filled 2017-11-08: qty 1

## 2017-11-08 MED ORDER — ACETAMINOPHEN 650 MG RE SUPP: 650.0000 mg | Freq: Four times a day (QID) | RECTAL | Status: DC | PRN

## 2017-11-08 MED ORDER — SENNOSIDES-DOCUSATE SODIUM 8.6-50 MG PO TABS: 1.0000 | ORAL_TABLET | Freq: Every evening | ORAL | Status: DC | PRN

## 2017-11-08 MED ORDER — LORAZEPAM 2 MG/ML IJ SOLN
1.0000 mg | Freq: Once | INTRAMUSCULAR | Status: AC
  Administered 2017-11-08: 1 mg via INTRAVENOUS

## 2017-11-08 MED ORDER — ENOXAPARIN SODIUM 40 MG/0.4ML ~~LOC~~ SOLN
40.0000 mg | SUBCUTANEOUS | Status: DC
  Administered 2017-11-08 – 2017-11-10 (×3): 40 mg via SUBCUTANEOUS
  Filled 2017-11-08 (×3): qty 0.4

## 2017-11-08 MED ORDER — AZITHROMYCIN 250 MG PO TABS
500.0000 mg | ORAL_TABLET | Freq: Every day | ORAL | Status: DC
  Filled 2017-11-08: qty 2

## 2017-11-08 MED ORDER — INSULIN ASPART 100 UNIT/ML ~~LOC~~ SOLN: 0.0000 [IU] | Freq: Every day | SUBCUTANEOUS | Status: DC

## 2017-11-08 MED ORDER — INSULIN ASPART 100 UNIT/ML ~~LOC~~ SOLN
0.0000 [IU] | Freq: Three times a day (TID) | SUBCUTANEOUS | Status: DC
  Administered 2017-11-08: 2 [IU] via SUBCUTANEOUS
  Administered 2017-11-08 – 2017-11-09 (×2): 5 [IU] via SUBCUTANEOUS
  Administered 2017-11-09 – 2017-11-10 (×2): 3 [IU] via SUBCUTANEOUS
  Administered 2017-11-10 (×2): 5 [IU] via SUBCUTANEOUS
  Administered 2017-11-11: 3 [IU] via SUBCUTANEOUS
  Filled 2017-11-08 (×8): qty 1

## 2017-11-08 MED ORDER — AZITHROMYCIN 250 MG PO TABS: 250.0000 mg | ORAL_TABLET | Freq: Every day | ORAL | Status: DC

## 2017-11-08 MED ORDER — ESCITALOPRAM OXALATE 10 MG PO TABS
20.0000 mg | ORAL_TABLET | Freq: Every day | ORAL | Status: DC
  Administered 2017-11-09 – 2017-11-11 (×3): 20 mg via ORAL
  Filled 2017-11-08 (×4): qty 2

## 2017-11-08 MED ORDER — DIPHENHYDRAMINE HCL 50 MG/ML IJ SOLN
25.0000 mg | Freq: Once | INTRAMUSCULAR | Status: AC
  Administered 2017-11-08: 25 mg via INTRAVENOUS

## 2017-11-08 MED ORDER — ACETAMINOPHEN 325 MG PO TABS: 650.0000 mg | ORAL_TABLET | Freq: Four times a day (QID) | ORAL | Status: DC | PRN

## 2017-11-08 MED ORDER — SODIUM CHLORIDE 0.9 % IV SOLN
500.0000 mg | Freq: Every day | INTRAVENOUS | Status: DC
  Administered 2017-11-08 – 2017-11-10 (×3): 500 mg via INTRAVENOUS
  Filled 2017-11-08 (×3): qty 500

## 2017-11-08 MED ORDER — ONDANSETRON HCL 4 MG PO TABS: 4.0000 mg | ORAL_TABLET | Freq: Four times a day (QID) | ORAL | Status: DC | PRN

## 2017-11-08 MED ORDER — BISACODYL 5 MG PO TBEC: 5.0000 mg | DELAYED_RELEASE_TABLET | Freq: Every day | ORAL | Status: DC | PRN

## 2017-11-08 MED ORDER — PIPERACILLIN-TAZOBACTAM 3.375 G IVPB
3.3750 g | Freq: Three times a day (TID) | INTRAVENOUS | Status: DC
  Filled 2017-11-08: qty 50

## 2017-11-08 NOTE — Consult Note (Signed)
[facility] Consult   Reason for Consult: Consult for this 57 year old man in the hospital with altered mental status of unclear etiology Referring Physician: Bobbye Charleston Patient Identification: Dennis Pineda MRN:  [id number] Principal Diagnosis: Altered mental status Diagnosis:   Patient Active Problem List   Diagnosis Date Noted  . Altered mental status [R41.82] 11/08/2017  . At risk for abuse of opiates [Z91.89] 11/08/2017  . Chronic pain syndrome [G89.4] 11/08/2017  . Syncope [R55] 09/19/2017  . Decreased responsiveness [R41.89] 09/18/2017  . Diabetes mellitus without complication ([place]) [Y07.3] 05/10/2015  . Hypertension [I10] 05/10/2015  . Depression [F32.9] 05/10/2015  . Fracture of multiple ribs [S22.49XA] 05/01/2015  . Angioedema [T78.3XXA] 09/14/2014    Total Time spent with patient: 1 hour  Subjective:   Dennis Pineda is a 57 y.o. male patient admitted with patient not currently able to give any information.  HPI: Patient seen.  Chart reviewed.  I reviewed quite a bit of his chart including many of the admissions and emergency room visits to our facility as well as some of the notes from other facilities.  Also reviewed the controlled substance database.  This is a 57 year old man who presented to the hospital with altered mental status.  This is not the first time this apparently has occurred in fact there is seem to have been several presentations like this so far without any clear explanation.  Workup so far has been unrevealing.  Radiographic imaging has been unrevealing.  EEG was normal and unremarkable.  Drug screen was positive only for try cyclic's which would be consistent with some prescribed medicine.  No history given of recent injury.  When I came to see the patient this afternoon I found him in bed with his eyes closed.  I spoke his name several times loudly.  I also touched him on the shoulder and shook him gently back and forth while speaking his  name loudly.  Patient did not wake up did not open his eyes.  Did not make any movement although he did snore more loudly after I had shaken him which is usually the opposite of what one expects.  In any case I was not able to wake him up to get any information.  I reviewed the [facility] and it does not look like he has received any medication at all today that would be sedating or would account for him still being asleep this afternoon.  Patient is listed as being on several chronic medicines including to "muscle relaxers" Flexeril and Robaxin.  The doses are not very large in the chart suggests that the wife says he does not overuse these medicines.  Patient is listed as being on buprenorphine.  I checked the controlled substance database and found that he has been receiving prescriptions of buprenorphine for quite a while although the most recently filled 1 was on September 5 and was only a 7-day supply.  I called the pharmacy that he seems to always patronize and they said that that was the last buprenorphine prescription they filled for him.  In that case he would have presumably run out of the medicine around the 12th.  Looking back through the patient's chart he has had a remarkable number of presentations to emergency rooms for a wide variety of complaints.  Much of it relating to gout and chronic pain.  More recently has had more than one presentation with this altered mental status of unclear etiology.  Substance abuse history: Not able to get any  information from the patient.  He has been maintained on buprenorphine for quite a while by outpatient providers.  Nothing in our chart otherwise suggest concern that I can find about substance abuse.  Unclear where or when the substance abuse issue became present.  Patient is not prescribed any other controlled substances.  Medical history: Patient apparently has gout has diabetes high blood pressure has had several orthopedic injuries over time.  He is prescribed  muscle relaxers for unclear indication  Social history: According to the chart the patient is married and lives with his wife who is described as being disabled from a seizure disorder.  The chart suggests that the patient works regularly.  I was not able to speak to him about any of this.    Past Psychiatric History: Depression is listed as being a past diagnosis and apparently he is thought to be on Lexapro currently.  I could not find any previous psychiatric notes in any of the records we have.  No evidence of psychiatric hospitalization in the past.  Risk to Self:   Risk to Others:   Prior Inpatient Therapy:   Prior Outpatient Therapy:    Past Medical History:  Past Medical History:  Diagnosis Date  . Arthritis    gout  . Depression   . Diabetes mellitus without complication ([place])   . Hypertension     Past Surgical History:  Procedure Laterality Date  . APPENDECTOMY    . KNEE SURGERY    . ROTATOR CUFF REPAIR    . SHOULDER SURGERY     Family History:  Family History  Problem Relation Age of Onset  . Diabetes Mother   . Cancer Father        Liver  . Diabetes Father   . Diabetes Sister   . Diabetes Maternal Grandmother    Family Psychiatric  History: Unknown Social History:  Social History   Substance and Sexual Activity  Alcohol Use No  . Alcohol/week: 0.0 standard drinks     Social History   Substance and Sexual Activity  Drug Use No    Social History   Socioeconomic History  . Marital status: Married    Spouse name: Not on file  . Number of children: Not on file  . Years of education: Not on file  . Highest education level: Not on file  Occupational History  . Not on file  Social Needs  . Financial resource strain: Not hard at all  . Food insecurity:    Worry: Never true    Inability: Never true  . Transportation needs:    Medical: Yes    Non-medical: Yes  Tobacco Use  . Smoking status: Former Smoker    Types: Cigarettes  . Smokeless  tobacco: Never Used  Substance and Sexual Activity  . Alcohol use: No    Alcohol/week: 0.0 standard drinks  . Drug use: No  . Sexual activity: Yes  Lifestyle  . Physical activity:    Days per week: 4 days    Minutes per session: 40 min  . Stress: To some extent  Relationships  . Social connections:    Talks on phone: Twice a week    Gets together: Twice a week    Attends religious service: Never    Active member of club or organization: No    Attends meetings of clubs or organizations: Never    Relationship status: Married  Other Topics Concern  . Not on file  Social History Narrative  Lives with wife, works, drives.   Additional Social History:    Allergies:  No Known Allergies  Labs:  Results for orders placed or performed during the hospital encounter of 11/08/17 (from the past 48 hour(s))  Glucose, capillary     Status: Abnormal   Collection Time: 11/08/17  3:10 AM  Result Value Ref Range   Glucose-Capillary 342 (H) 70 - 99 mg/dL  Comprehensive metabolic panel     Status: Abnormal   Collection Time: 11/08/17  3:17 AM  Result Value Ref Range   Sodium 132 (L) 135 - 145 mmol/L   Potassium 4.5 3.5 - 5.1 mmol/L    Comment: HEMOLYSIS AT THIS LEVEL MAY AFFECT RESULT   Chloride 96 (L) 98 - 111 mmol/L   CO2 24 22 - 32 mmol/L   Glucose, Bld 351 (H) 70 - 99 mg/dL   BUN 25 (H) 6 - 20 mg/dL   Creatinine, Ser 0.94 0.61 - 1.24 mg/dL   Calcium 9.7 8.9 - 10.3 mg/dL   Total Protein 7.7 6.5 - 8.1 g/dL   Albumin 4.3 3.5 - 5.0 g/dL   AST 24 15 - 41 U/L   ALT 19 0 - 44 U/L   Alkaline Phosphatase 93 38 - 126 U/L   Total Bilirubin 0.6 0.3 - 1.2 mg/dL   GFR calc non Af Amer >60 >60 mL/min   GFR calc Af Amer >60 >60 mL/min    Comment: (NOTE) The eGFR has been calculated using the CKD EPI equation. This calculation has not been validated in all clinical situations. eGFR's persistently <60 mL/min signify possible Chronic Kidney Disease.    Anion gap 12 5 - 15    Comment:  Performed at [facility], [address] [place], [place] [address]  CBC     Status: None   Collection Time: 11/08/17  3:17 AM  Result Value Ref Range   WBC 6.1 3.8 - 10.6 K/uL   RBC 4.60 4.40 - 5.90 MIL/uL   Hemoglobin 14.0 13.0 - 18.0 g/dL   HCT 40.2 40.0 - 52.0 %   MCV 87.3 80.0 - 100.0 fL   MCH 30.4 26.0 - 34.0 pg   MCHC 34.8 32.0 - 36.0 g/dL   RDW 13.4 11.5 - 14.5 %   Platelets 279 150 - 440 K/uL    Comment: Performed at [facility], [address] [place], [place] [address]  Acetaminophen level     Status: Abnormal   Collection Time: 11/08/17  3:17 AM  Result Value Ref Range   Acetaminophen (Tylenol), Serum <10 (L) 10 - 30 ug/mL    Comment: (NOTE) Therapeutic concentrations vary significantly. A range of 10-30 ug/mL  may be an effective concentration for many patients. However, some  are best treated at concentrations outside of this range. Acetaminophen concentrations >150 ug/mL at 4 hours after ingestion  and >50 ug/mL at 12 hours after ingestion are often associated with  toxic reactions. Performed at [facility], [address] [place], [place] [address]   Salicylate level     Status: None   Collection Time: 11/08/17  3:17 AM  Result Value Ref Range   Salicylate Lvl <0.1 2.8 - 30.0 mg/dL    Comment: Performed at [facility], [address] [place], [place] [address]  Ammonia     Status: None   Collection Time: 11/08/17  3:17 AM  Result Value Ref Range   Ammonia 24 9 - 35 umol/L    Comment: Performed at  [facility], [address], [place], [place] [address]  Folate     Status: None   Collection Time: 11/08/17  3:17 AM  Result Value Ref Range   Folate 39.0 >5.9 ng/mL    Comment: HEMOLYSIS AT THIS LEVEL MAY AFFECT RESULT Performed at [facility], [place], [place] [address]   Rapid HIV screen (HIV 1/2 Ab+Ag)     Status: None   Collection Time: 11/08/17  3:17 AM  Result Value Ref  Range   HIV-1 P24 Antigen - HIV24 NON REACTIVE NON REACTIVE   HIV 1/2 Antibodies NON REACTIVE NON REACTIVE   Interpretation (HIV Ag Ab)      A non reactive test result means that HIV 1 or HIV 2 antibodies and HIV 1 p24 antigen were not detected in the specimen.    Comment: Performed at [facility], [address] [place], [place] [address]  TSH     Status: None   Collection Time: 11/08/17  3:17 AM  Result Value Ref Range   TSH 1.573 0.350 - 4.500 uIU/mL    Comment: Performed by a 3rd Generation assay with a functional sensitivity of <=0.01 uIU/mL. Performed at [facility], [address] [place],  [address]   Glucose, capillary     Status: Abnormal   Collection Time: 11/08/17  5:17 AM  Result Value Ref Range   Glucose-Capillary 342 (H) 70 - 99 mg/dL  Urine Drug Screen, Qualitative ([facility] only)     Status: Abnormal   Collection Time: 11/08/17  5:19 AM  Result Value Ref Range   Tricyclic, Ur Screen DETECTED (A) NONE DETECTED   Amphetamines, Ur Screen NONE DETECTED NONE DETECTED   MDMA (Ecstasy)Ur Screen NONE DETECTED NONE DETECTED   Cocaine Metabolite,Ur [place] NONE DETECTED NONE DETECTED   Opiate, Ur Screen NONE DETECTED NONE DETECTED   Phencyclidine (PCP) Ur S NONE DETECTED NONE DETECTED   Cannabinoid 50 Ng, Ur [place] NONE DETECTED NONE DETECTED   Barbiturates, Ur Screen NONE DETECTED NONE DETECTED   Benzodiazepine, Ur Scrn NONE DETECTED NONE DETECTED   Methadone Scn, Ur NONE DETECTED NONE DETECTED    Comment: (NOTE) Tricyclics + metabolites, urine    Cutoff 1000 ng/mL Amphetamines + metabolites, urine  Cutoff 1000 ng/mL MDMA (Ecstasy), urine              Cutoff 500 ng/mL Cocaine Metabolite, urine          Cutoff 300 ng/mL Opiate + metabolites, urine        Cutoff 300 ng/mL Phencyclidine (PCP), urine         Cutoff 25 ng/mL Cannabinoid, urine                 Cutoff 50 ng/mL Barbiturates + metabolites, urine  Cutoff 200 ng/mL Benzodiazepine, urine               Cutoff 200 ng/mL Methadone, urine                   Cutoff 300 ng/mL The urine drug screen provides only a preliminary, unconfirmed analytical test result and should not be used for non-medical purposes. Clinical consideration and professional judgment should be applied to any positive drug screen result due to possible interfering substances. A more specific alternate chemical method must be used in order to obtain a confirmed analytical result. Gas chromatography / mass spectrometry (GC/MS) is the preferred confirmat ory method. Performed at [facility], [address] [place], [place] [address]   Urinalysis, Complete w Microscopic     Status: Abnormal   Collection Time: 11/08/17  5:19 AM  Result Value Ref Range   Color, Urine STRAW (A) YELLOW   APPearance CLEAR (A) CLEAR   Specific Gravity, Urine 1.014 1.005 - 1.030   pH 5.0 5.0 - 8.0   Glucose, UA >=500 (A) NEGATIVE mg/dL   Hgb urine dipstick NEGATIVE NEGATIVE   Bilirubin Urine NEGATIVE NEGATIVE   Ketones, ur NEGATIVE NEGATIVE mg/dL   Protein, ur NEGATIVE NEGATIVE mg/dL   Nitrite NEGATIVE NEGATIVE   Leukocytes, UA NEGATIVE NEGATIVE   WBC, UA 0-5 0 - 5 WBC/hpf   Bacteria, UA NONE SEEN NONE SEEN   Squamous Epithelial / LPF NONE SEEN 0 - 5    Comment: Performed at [facility], [address] [place], [place] [address]  Glucose, capillary     Status: Abnormal   Collection Time: 11/08/17  7:52 AM  Result Value Ref Range   Glucose-Capillary 247 (H) 70 - 99 mg/dL  Vitamin B12     Status: None   Collection Time: 11/08/17  8:45 AM  Result Value Ref Range   Vitamin B-12 580 180 - 914 pg/mL    Comment: (NOTE) This assay is not validated for testing neonatal or myeloproliferative syndrome specimens for Vitamin B12 levels. Performed at [facility], [place] [address] [place], [place] [address]   Glucose, capillary     Status: Abnormal   Collection Time: 11/08/17 11:54 AM  Result Value Ref Range    Glucose-Capillary 108 (H) 70 - 99 mg/dL    Current Facility-Administered Medications  Medication Dose Route Frequency Provider Last Rate Last Dose  . 0.9 %  sodium chloride infusion   Intravenous Continuous Loletha Grayer, MD 75 mL/hr at 11/08/17 [phone]    . acetaminophen (TYLENOL) tablet 650 mg  650 mg Oral Q6H PRN Loletha Grayer, MD       Or  . acetaminophen (TYLENOL) suppository 650 mg  650 mg Rectal Q6H PRN Wieting, Richard, MD      . azithromycin (ZITHROMAX) 500 mg in sodium chloride 0.9 % 250 mL IVPB  500 mg Intravenous Daily Loletha Grayer, MD 250 mL/hr at 11/08/17 1419 500 mg at 11/08/17 1419  . bisacodyl (DULCOLAX) EC tablet 5 mg  5 mg Oral Daily PRN Loletha Grayer, MD      . enoxaparin (LOVENOX) injection 40 mg  40 mg Subcutaneous Q24H Wieting, Richard, MD      . Derrill Memo ON 11/09/2017] escitalopram (LEXAPRO) tablet 20 mg  20 mg Oral Daily Wieting, Richard, MD      . insulin aspart (novoLOG) injection 0-15 Units  0-15 Units Subcutaneous TID WC Loletha Grayer, MD   5 Units at 11/08/17 0818  . insulin aspart (novoLOG) injection 0-5 Units  0-5 Units Subcutaneous QHS Wieting, Richard, MD      . ondansetron [facility]) tablet 4 mg  4 mg Oral Q6H PRN Wieting, Richard, MD       Or  . ondansetron (ZOFRAN) injection 4 mg  4 mg Intravenous Q6H PRN Wieting, Richard, MD      . piperacillin-tazobactam (ZOSYN) IVPB 3.375 g  3.375 g Intravenous Q8H Wieting, Richard, MD      . senna-docusate (Senokot-S) tablet 1 tablet  1 tablet Oral QHS PRN Loletha Grayer, MD        Musculoskeletal: Strength & Muscle Tone: decreased Gait & Station: unable to stand Patient leans: N/A  Psychiatric Specialty Exam: Physical  Exam  Nursing note and vitals reviewed. Constitutional: He appears well-developed and well-nourished.  HENT:  Head: Normocephalic and atraumatic.  Eyes: Pupils are equal, round, and reactive to light. Conjunctivae are normal.  Neck: Normal range of motion.  Cardiovascular: Regular  rhythm and normal heart sounds.  Respiratory: Effort normal. No respiratory distress.  GI: Soft.  Musculoskeletal: Normal range of motion.  Neurological: He is alert.  Skin: Skin is warm and dry.  Psychiatric: His affect is blunt.    Review of Systems  Unable to perform ROS: Patient unresponsive    Blood pressure 108/75, pulse 62, temperature 98.9 F (37.2 C), temperature source Oral, resp. rate 18, height _0  (1.778 m), weight 83.4 kg, SpO2 94 %.Body mass index is 26.39 kg/m.  General Appearance: Fairly Groomed  Eye Contact:  None  Speech:  Negative  Volume:  Decreased  Mood:  Negative  Affect:  Negative  Thought Process:  NA  Orientation:  Negative  Thought Content:  Negative  Suicidal Thoughts:  No  Homicidal Thoughts:  No  Memory:  Negative  Judgement:  Negative  Insight:  Negative  Psychomotor Activity:  Negative  Concentration:  Concentration: Negative  Recall:  Negative  Fund of Knowledge:  Negative  Language:  Negative  Akathisia:  Negative  Handed:  Right  AIMS (if indicated):     Assets:  Social Support  ADL's:  Impaired  Cognition:  Impaired,  Moderate  Sleep:        Treatment Plan Summary: Medication management and Plan Patient with altered mental status of unknown etiology.  Workup so far unrevealing.  There is no evidence of any anatomic disruption to the central nervous system.  EEG was unremarkable.  No sign of seizures.  Drug screen positive for try cyclic antidepressants which could represent the Flexeril.  Otherwise negative.  Patient does not appear to be septic or critically ill in any specific way.  Under the circumstances I have little definite that I can add to this.  Patient is sleeping this afternoon in a manner that I found very unusual given that he has not had any sedating medicine today and he did not make even the slightest moved to wake up when I shook him.  I have to say that that suggests to me that he may be at least in part "playing  possum" but of course I cannot directly prove that.  For now my suggestion is consistent with what is being done which is to say to minimize all medication.  I do not see any indication for the muscle relaxers.  I have discontinued the buprenorphine.  It is not clear to me whether he is still an ongoing client given that he does not have a current active prescription.  I tried to call the provider who is listed as writing that prescription and was not able to find any office number or contact for that person on line.  The Lexapro is probably fine.  That medicine is almost never sedating and there is nothing about this that looks like serotonin syndrome so I think we can rule out that that is causing any of the problem.  I will follow-up tomorrow.  Disposition: No evidence of imminent risk to self or others at present.   Patient does not meet criteria for psychiatric inpatient admission.  Alethia Berthold, MD 11/08/2017 4:38 PM

## 2017-11-08 NOTE — Progress Notes (Signed)
eeg completed ° °

## 2017-11-08 NOTE — Plan of Care (Signed)
  Problem: Nutrition: Goal: Adequate nutrition will be maintained Outcome: Progressing   Problem: Safety: Goal: Ability to remain free from injury will improve Outcome: Progressing   

## 2017-11-08 NOTE — Progress Notes (Signed)
Pharmacy Antibiotic Note  Jaaron CarcuevTomi Bambergera is a 57 y.o. male admitted on 11/08/2017 with aspiration pneumonia.  Pharmacy has been consulted for Zosyn dosing. He was admitted with AMS but CXR shows PNA.   Plan: Zosyn 3.375g IV q8h (4 hour infusion).  Height: 5\' 10"  (177.8 cm) Weight: 183 lb 14.4 oz (83.4 kg) IBW/kg (Calculated) : 73  Temp (24hrs), Avg:98.9 F (37.2 C), Min:98.9 F (37.2 C), Max:98.9 F (37.2 C)  Recent Labs  Lab 11/08/17 0317  WBC 6.1  CREATININE 0.94    Estimated Creatinine Clearance: 90.6 mL/min (by C-G formula based on SCr of 0.94 mg/dL).    No Known Allergies  Antimicrobials this admission: Zosyn 9/18 >>  azithromycin 9/17 >>   Thank you for allowing pharmacy to be a part of this patient's care.  Lowella Bandyodney D Orlena Garmon, PharmD 11/08/2017 7:40 AM

## 2017-11-08 NOTE — Progress Notes (Signed)
Inpatient Diabetes Program Recommendations  AACE/ADA: New Consensus Statement on Inpatient Glycemic Control (2019)  Target Ranges:  Prepandial:   less than 140 mg/dL      Peak postprandial:   less than 180 mg/dL (1-2 hours)      Critically ill patients:  140 - 180 mg/dL   Results for Dennis Pineda, Dennis Pineda (MRN [id number]) as of 11/08/2017 11:02  Ref. Range 11/08/2017 03:10 11/08/2017 05:17 11/08/2017 07:52  Glucose-Capillary Latest Ref Range: 70 - 99 mg/dL 409342 (H) 811342 (H) 914247 (H)   Review of Glycemic Control  Diabetes history: DM2 Outpatient Diabetes medications: None listed (noted in Care Everywhere OP visit note prescribed Metformin 500 mg BID) Current orders for Inpatient glycemic control: Novolog 0-15 units TID with meals, Novolog 0-5 units QHS  Inpatient Diabetes Program Recommendations: Insulin - Basal: Noted Novolog correction was just started this morning. If glucose is consistently greater than 180 mg/dl with Novolog correction, please consider ordering Levemir 8 units Q24H (based on 83.4 kg x 0.1 units). HgbA1C: A1C in process.  Thanks, Orlando PennerMarie Jodi Criscuolo, RN, MSN, CDE Diabetes Coordinator Inpatient Diabetes Program [phone] (Team Pager from 8am to 5pm)

## 2017-11-08 NOTE — Care Management Note (Signed)
Case Management Note  Patient Details  Name: Dennis BambergerManuel Pineda MRN: [id number] Date of Birth: 08-24-1960  Subjective/Objective:               Action/Plan:   Expected Discharge Date:                  Expected Discharge Plan:  Home/Self Care  In-House Referral:     Discharge planning Services     Post Acute Care Choice:    Choice offered to:     DME Arranged:    DME Agency:     HH Arranged:    HH Agency:     Status of Service:  In process, will continue to follow  If discussed at Long Length of Stay Meetings, dates discussed:    Additional Comments:  Sherren KernsJennifer L Charliene Inoue, RN 11/08/2017, 12:07 PM

## 2017-11-08 NOTE — ED Notes (Signed)
Pt remains responsive to tactile stimuli. Sleeping. No longer diaphoretic.

## 2017-11-08 NOTE — H&P (Signed)
[facility] - [place] at [facility]   PATIENT NAME: Dennis Pineda    MR#:  [id number]  DATE OF BIRTH:  19-Jan-1961  DATE OF ADMISSION:  11/08/2017  PRIMARY CARE PHYSICIAN: Patient, No Pcp Per   REQUESTING/REFERRING PHYSICIAN: Willy Eddyobinson, Patrick, MD  CHIEF COMPLAINT:   Chief Complaint  Patient presents with  . Altered Mental Status    HISTORY OF PRESENT ILLNESS:  Dennis Pineda  is a 57 y.o. male with a known history of depression, T2NIDDM, HTN, gout p/w AMS/confusion, lethargy, visual hallucinations. Pt is AAOx0, and does not provide verbal response to my questions. He is lethargic and ill-appearing, but is easily arousable. He does not appear toxic. When I call his name or tap his shoulder, he opens his eyes briefly and then closes them again. He is restless/agitated, fidgeting and moving about in bed. He occasionally exhibits jerking movements, but these movements do not appear as myoclonus. He cannot provide any Hx/ROS. Per ED provider/staff and documentation, pt p/w 2d Hx confusion and agitation, acutely worse since last evening (Tuesday 11/07/2017). There was report of visual hallucinations per wife, w/ pt reportedly stating the presence of an intruder in the house that was not actually there. He was reportedly also grabbing/reaching towards things that are not there. There have been no medication changes. He had an admission under similar circumstances In late 08/2017.  PAST MEDICAL HISTORY:   Past Medical History:  Diagnosis Date  . Arthritis    gout  . Depression   . Diabetes mellitus without complication (HCC)   . Hypertension     PAST SURGICAL HISTORY:   Past Surgical History:  Procedure Laterality Date  . APPENDECTOMY    . KNEE SURGERY    . ROTATOR CUFF REPAIR    . SHOULDER SURGERY      SOCIAL HISTORY:   Social History   Tobacco Use  . Smoking status: Former Smoker    Types: Cigarettes  . Smokeless tobacco: Never Used  Substance Use  Topics  . Alcohol use: No    Alcohol/week: 0.0 standard drinks    FAMILY HISTORY:   Family History  Problem Relation Age of Onset  . Diabetes Mother   . Cancer Father        Liver  . Diabetes Father   . Diabetes Sister   . Diabetes Maternal Grandmother     DRUG ALLERGIES:  No Known Allergies  REVIEW OF SYSTEMS:   Review of Systems  Unable to perform ROS: Mental status change  Psychiatric/Behavioral: Positive for hallucinations.   AMS/confusion, not responding verbally or following commands. MEDICATIONS AT HOME:   Prior to Admission medications   Medication Sig Start Date End Date Taking? Authorizing Provider  allopurinol (ZYLOPRIM) 100 MG tablet Take 1 tablet (100 mg total) by mouth daily. 07/18/17 09/16/17  Menshew, Charlesetta IvoryJenise V Bacon, PA-C  buprenorphine-naloxone (SUBOXONE) 8-2 mg SUBL SL tablet Place 1 tablet under the tongue daily.    [provider]  cyclobenzaprine (FLEXERIL) 10 MG tablet Take 10 mg by mouth 3 (three) times daily as needed for muscle spasms.    [provider]  escitalopram (LEXAPRO) 20 MG tablet Take 20 mg by mouth daily. 09/07/17   [provider]  meloxicam (MOBIC) 15 MG tablet Take 1 tablet (15 mg total) by mouth daily. 09/26/17   Cuthriell, Delorise RoyalsJonathan D, PA-C  methocarbamol (ROBAXIN) 500 MG tablet Take 1 tablet (500 mg total) by mouth at bedtime. 09/26/17   Cuthriell, Delorise RoyalsJonathan D, PA-C  Naphazoline HCl (CLEAR EYES OP) Place 2 drops into both eyes daily as needed (for red eyes).    [provider]  naproxen (NAPROSYN) 500 MG tablet Take 1 tablet (500 mg total) by mouth 2 (two) times daily with a meal. 11/24/13   Harris, Abigail, PA-C  predniSONE (DELTASONE) 10 MG tablet Take 1 tablet (10 mg total) by mouth daily. 09/26/17   Cuthriell, Delorise Royals, PA-C  sertraline (ZOLOFT) 100 MG tablet Take 1 tablet (100 mg total) by mouth daily. 10/29/13   Le, Thao P, DO      VITAL SIGNS:  Blood pressure 107/82, pulse 65, temperature 98.9 F  (37.2 C), temperature source Oral, resp. rate 14, height 5\' 10"  (1.778 m), weight 77.1 kg, SpO2 97 %.  PHYSICAL EXAMINATION:  Physical Exam  Constitutional: He appears well-developed and well-nourished. He appears lethargic. He is active. He is easily aroused.  Non-toxic appearance. He does not have a sickly appearance. He appears ill. No distress. He is not intubated.  HENT:  Head: Normocephalic and atraumatic.  Mouth/Throat: Oropharynx is clear and moist. No oropharyngeal exudate.  Eyes: Conjunctivae, EOM and lids are normal. No scleral icterus.  Neck: Neck supple. No JVD present. No thyromegaly present.  Cardiovascular: Normal rate, regular rhythm, S1 normal, S2 normal and normal heart sounds.  No extrasystoles are present. Exam reveals no gallop, no S3, no S4, no distant heart sounds and no friction rub.  No murmur heard. Pulmonary/Chest: Effort normal and breath sounds normal. No accessory muscle usage or stridor. No apnea, no tachypnea and no bradypnea. He is not intubated. No respiratory distress. He has no decreased breath sounds. He has no wheezes. He has no rhonchi. He has no rales.  Abdominal: Soft. Bowel sounds are normal. He exhibits no distension. There is no tenderness. There is no rigidity, no rebound and no guarding.  Musculoskeletal: Normal range of motion. He exhibits no edema or tenderness.  Lymphadenopathy:    He has no cervical adenopathy.  Neurological: He is easily aroused. He appears lethargic. He is disoriented.  Skin: Skin is warm and dry. No rash noted. He is not diaphoretic. No erythema.  Psychiatric: He is agitated. He is noncommunicative. He is inattentive.   LABORATORY PANEL:   CBC Recent Labs  Lab 11/08/17 0317  WBC 6.1  HGB 14.0  HCT 40.2  PLT 279   ------------------------------------------------------------------------------------------------------------------  Chemistries  Recent Labs  Lab 11/08/17 0317  NA 132*  K 4.5  CL 96*  CO2 24    GLUCOSE 351*  BUN 25*  CREATININE 0.94  CALCIUM 9.7  AST 24  ALT 19  ALKPHOS 93  BILITOT 0.6   ------------------------------------------------------------------------------------------------------------------  Cardiac Enzymes No results for input(s): TROPONINI in the last 168 hours. ------------------------------------------------------------------------------------------------------------------  RADIOLOGY:  No results found.  IMPRESSION AND PLAN:   A/P: 40M AMS/confusion, suspect toxic/metabolic etiology (most likely polypharmacy), DDx central neurological vs. psychiatric. Dehydration, hypochloremic hyponatremia, hyperglycemia + glycosuria (w/ T2NIDDM), prerenal azotemia. -AMS/confusion: Pt w/ AMS/confusion, disoriented and non-verbal. He is lethargic but arousable. He is protecting his airway. He is restless and fidgeting. He has jerking movements at times, but these do not appear to be involuntary (i.e muscle spasms, myoclonic jerks), and appear to be "purposeful"/"voluntary" in nature. His presentation is suspicious for a toxic/metabolic etiology of AMS, most likely 2/2 a combination of polypharmacy and dehydration. He had a similar presentation in late 08/2017, which improved/resolved w/ hydration and the tincture of time. MRI brain and EEG performed at that time were (-).  On present admission, UTox is (+) for TCAs, but pt is not on any TCAs at home. Per Poison Control, Flexeril can cause a false positive for TCAs on UDS. He is on a number of psychotropic and antispasmodic medications, including Flexeril, Robaxin, Lexapro, Zoloft and Suboxone. Tylenol and ASA level WNL. Labwork demonstrates hypochloremic hyponatremia, likely 2/2 dehydration (exacerbated by uncontrolled DM w/ hyperglycemia + glycosuria), BUN/Cr ratio ~27. IVF, hold home medications. Possibility of seizure still exists, rpt EEG pending. No further neuroimaging ordered at present. Neurology and Psychiatry consults  requested. U/A and CXR (-), afebrile, WBC WNL, low suspicion for meningitis/encephalitis or other acute bacterial infxn. Neuro checks, seizure precautions, fall precautions. -Dehydration, hypochloremic hyponatremia, prerenal azotemia: Labwork demonstrates hypochloremic hyponatremia, likely 2/2 dehydration (exacerbated by uncontrolled DM w/ hyperglycemia + glycosuria), BUN/Cr ratio ~27. IVF, monitor BMP. -Hyperglycemia + glycosuria, T2NIDDM: SSI. HbA1c pending. -Holding home meds as above. -FEN/GI: Cardiac diabetic diet as tolerated. -DVT PPx: Lovenox. -Code status: Full code. -Disposition: Admission, > 2 midnights.   All the records are reviewed and case discussed with ED provider. Management plans discussed with the patient, family and they are in agreement.  CODE STATUS: Full code.  TOTAL TIME TAKING CARE OF THIS PATIENT: 75 minutes.    Barbaraann Rondo M.D on 11/08/2017 at 6:28 AM  Between 7am to 6pm - Pager - [phone]  After 6pm go to [email or web address] - Social research officer, government  [facility] [place] [facility]  Office  [phone]  CC: Primary care physician; Patient, No Pcp Per   Note: This dictation was prepared with Dragon dictation along with smaller phrase technology. Any transcriptional errors that result from this process are unintentional.

## 2017-11-08 NOTE — ED Provider Notes (Signed)
[facility] Emergency Department Provider Note    First MD Initiated Contact with Patient 11/08/17 [phone]     (approximate)  I have reviewed the triage vital signs and the nursing notes.   HISTORY  Chief Complaint Altered Mental Status  Level v caveat: ams  HPI Tomi BambergerManuel Wasko is a 57 y.o. male p/w confusion, agitation   tremor and aggressiveness towards wife over the past 48 hours.  Patient had similar episode in presentation in July.  Does have a history of depression.  No history of schizophrenia.  Wife states that she brought him to the ER tonight because he started seeing people coming in her house so he called the police.  He has had hallucinations like this in the past.  Denies any medication changes.  Has been aggressive and demonstrate anger towards his wife.  No SI or HI reported.   Past Medical History:  Diagnosis Date  . Arthritis    gout  . Depression   . Diabetes mellitus without complication (HCC)   . Hypertension    Family History  Problem Relation Age of Onset  . Diabetes Mother   . Cancer Father        Liver  . Diabetes Father   . Diabetes Sister   . Diabetes Maternal Grandmother    Past Surgical History:  Procedure Laterality Date  . APPENDECTOMY    . KNEE SURGERY    . ROTATOR CUFF REPAIR    . SHOULDER SURGERY     Patient Active Problem List   Diagnosis Date Noted  . Altered mental status 11/08/2017  . Syncope 09/19/2017  . Decreased responsiveness 09/18/2017  . Diabetes mellitus without complication (HCC) 05/10/2015  . Hypertension 05/10/2015  . Depression 05/10/2015  . Fracture of multiple ribs 05/01/2015  . Angioedema 09/14/2014      Prior to Admission medications   Medication Sig Start Date End Date Taking? Authorizing Provider  allopurinol (ZYLOPRIM) 100 MG tablet Take 1 tablet (100 mg total) by mouth daily. 07/18/17 09/16/17  Menshew, Charlesetta IvoryJenise V Bacon, PA-C  buprenorphine-naloxone (SUBOXONE) 8-2 mg SUBL SL  tablet Place 1 tablet under the tongue daily.    [provider]  cyclobenzaprine (FLEXERIL) 10 MG tablet Take 10 mg by mouth 3 (three) times daily as needed for muscle spasms.    [provider]  escitalopram (LEXAPRO) 20 MG tablet Take 20 mg by mouth daily. 09/07/17   [provider]  meloxicam (MOBIC) 15 MG tablet Take 1 tablet (15 mg total) by mouth daily. 09/26/17   Cuthriell, Delorise RoyalsJonathan D, PA-C  methocarbamol (ROBAXIN) 500 MG tablet Take 1 tablet (500 mg total) by mouth at bedtime. 09/26/17   Cuthriell, Delorise RoyalsJonathan D, PA-C  Naphazoline HCl (CLEAR EYES OP) Place 2 drops into both eyes daily as needed (for red eyes).    [provider]  naproxen (NAPROSYN) 500 MG tablet Take 1 tablet (500 mg total) by mouth 2 (two) times daily with a meal. 11/24/13   Harris, Abigail, PA-C  predniSONE (DELTASONE) 10 MG tablet Take 1 tablet (10 mg total) by mouth daily. 09/26/17   Cuthriell, Delorise RoyalsJonathan D, PA-C  sertraline (ZOLOFT) 100 MG tablet Take 1 tablet (100 mg total) by mouth daily. 10/29/13   Le, Thao P, DO    Allergies Patient has no known allergies.    Social History Social History   Tobacco Use  . Smoking status: Former Smoker    Types: Cigarettes  . Smokeless tobacco: Never Used  Substance  Use Topics  . Alcohol use: No    Alcohol/week: 0.0 standard drinks  . Drug use: No    Review of Systems Patient denies headaches, rhinorrhea, blurry vision, numbness, shortness of breath, chest pain, edema, cough, abdominal pain, nausea, vomiting, diarrhea, dysuria, fevers, rashes or hallucinations unless otherwise stated above in HPI. ____________________________________________   PHYSICAL EXAM:  VITAL SIGNS: Vitals:   11/08/17 0600 11/08/17 0630  BP: 107/82 103/69  Pulse: 65 63  Resp: 14 15  Temp:    SpO2: 97% 96%    Constitutional: Alert but disoriented x3.  Patient with diffuse intermittent jerking episodes.  Having difficulty sitting still.  Mumbling  incomprehensible words. Eyes: Conjunctivae are normal.  Head: Atraumatic. Nose: No congestion/rhinnorhea. Mouth/Throat: Mucous membranes are moist.   Neck: No stridor. Painless ROM.  Cardiovascular: Normal rate, regular rhythm. Grossly normal heart sounds.  Good peripheral circulation. Respiratory: Normal respiratory effort.  No retractions. Lungs CTAB. Gastrointestinal: Soft and nontender. No distention. No abdominal bruits. No CVA tenderness. Genitourinary:  Musculoskeletal: No lower extremity tenderness nor edema.  No joint effusions. Neurologic:  Disoriented, intermittent diffuse twitch and jerking symptoms Skin:  Skin is warm, dry and intact. No rash noted. Psychiatric: anxious and restless appearing  ____________________________________________   LABS (all labs ordered are listed, but only abnormal results are displayed)  Results for orders placed or performed during the hospital encounter of 11/08/17 (from the past 24 hour(s))  Glucose, capillary     Status: Abnormal   Collection Time: 11/08/17  3:10 AM  Result Value Ref Range   Glucose-Capillary 342 (H) 70 - 99 mg/dL  Comprehensive metabolic panel     Status: Abnormal   Collection Time: 11/08/17  3:17 AM  Result Value Ref Range   Sodium 132 (L) 135 - 145 mmol/L   Potassium 4.5 3.5 - 5.1 mmol/L   Chloride 96 (L) 98 - 111 mmol/L   CO2 24 22 - 32 mmol/L   Glucose, Bld 351 (H) 70 - 99 mg/dL   BUN 25 (H) 6 - 20 mg/dL   Creatinine, Ser 1.61 0.61 - 1.24 mg/dL   Calcium 9.7 8.9 - 09.6 mg/dL   Total Protein 7.7 6.5 - 8.1 g/dL   Albumin 4.3 3.5 - 5.0 g/dL   AST 24 15 - 41 U/L   ALT 19 0 - 44 U/L   Alkaline Phosphatase 93 38 - 126 U/L   Total Bilirubin 0.6 0.3 - 1.2 mg/dL   GFR calc non Af Amer >60 >60 mL/min   GFR calc Af Amer >60 >60 mL/min   Anion gap 12 5 - 15  CBC     Status: None   Collection Time: 11/08/17  3:17 AM  Result Value Ref Range   WBC 6.1 3.8 - 10.6 K/uL   RBC 4.60 4.40 - 5.90 MIL/uL   Hemoglobin 14.0  13.0 - 18.0 g/dL   HCT 04.5 40.9 - 81.1 %   MCV 87.3 80.0 - 100.0 fL   MCH 30.4 26.0 - 34.0 pg   MCHC 34.8 32.0 - 36.0 g/dL   RDW 91.4 78.2 - 95.6 %   Platelets 279 150 - 440 K/uL  Acetaminophen level     Status: Abnormal   Collection Time: 11/08/17  3:17 AM  Result Value Ref Range   Acetaminophen (Tylenol), Serum <10 (L) 10 - 30 ug/mL  Salicylate level     Status: None   Collection Time: 11/08/17  3:17 AM  Result Value Ref Range   Salicylate Lvl <7.0  2.8 - 30.0 mg/dL  Ammonia     Status: None   Collection Time: 11/08/17  3:17 AM  Result Value Ref Range   Ammonia 24 9 - 35 umol/L  Glucose, capillary     Status: Abnormal   Collection Time: 11/08/17  5:17 AM  Result Value Ref Range   Glucose-Capillary 342 (H) 70 - 99 mg/dL  Urine Drug Screen, Qualitative ([facility] only)     Status: Abnormal   Collection Time: 11/08/17  5:19 AM  Result Value Ref Range   Tricyclic, Ur Screen DETECTED (A) NONE DETECTED   Amphetamines, Ur Screen NONE DETECTED NONE DETECTED   MDMA (Ecstasy)Ur Screen NONE DETECTED NONE DETECTED   Cocaine Metabolite,Ur [place] NONE DETECTED NONE DETECTED   Opiate, Ur Screen NONE DETECTED NONE DETECTED   Phencyclidine (PCP) Ur S NONE DETECTED NONE DETECTED   Cannabinoid 50 Ng, Ur [place] NONE DETECTED NONE DETECTED   Barbiturates, Ur Screen NONE DETECTED NONE DETECTED   Benzodiazepine, Ur Scrn NONE DETECTED NONE DETECTED   Methadone Scn, Ur NONE DETECTED NONE DETECTED  Urinalysis, Complete w Microscopic     Status: Abnormal   Collection Time: 11/08/17  5:19 AM  Result Value Ref Range   Color, Urine STRAW (A) YELLOW   APPearance CLEAR (A) CLEAR   Specific Gravity, Urine 1.014 1.005 - 1.030   pH 5.0 5.0 - 8.0   Glucose, UA >=500 (A) NEGATIVE mg/dL   Hgb urine dipstick NEGATIVE NEGATIVE   Bilirubin Urine NEGATIVE NEGATIVE   Ketones, ur NEGATIVE NEGATIVE mg/dL   Protein, ur NEGATIVE NEGATIVE mg/dL   Nitrite NEGATIVE NEGATIVE   Leukocytes, UA NEGATIVE NEGATIVE   WBC, UA 0-5 0 -  5 WBC/hpf   Bacteria, UA NONE SEEN NONE SEEN   Squamous Epithelial / LPF NONE SEEN 0 - 5   ____________________________________________  EKG My review and personal interpretation at Time: 3:11   Indication: ams  Rate: 85  Rhythm: sinus Axis: normal Other: normal intervals, no stemi ____________________________________________  RADIOLOGY  I personally reviewed all radiographic images ordered to evaluate for the above acute complaints and reviewed radiology reports and findings.  These findings were personally discussed with the patient.  Please see medical record for radiology report.  ____________________________________________   PROCEDURES  Procedure(s) performed:  .Critical Care Performed by: Willy Eddy, MD Authorized by: Willy Eddy, MD   Critical care provider statement:    Critical care time (minutes):  41   Critical care time was exclusive of:  Separately billable procedures and treating other patients   Critical care was necessary to treat or prevent imminent or life-threatening deterioration of the following conditions:  Metabolic crisis and CNS failure or compromise   Critical care was time spent personally by me on the following activities:  Development of treatment plan with patient or surrogate, discussions with consultants, evaluation of patient's response to treatment, examination of patient, obtaining history from patient or surrogate, ordering and performing treatments and interventions, ordering and review of laboratory studies, ordering and review of radiographic studies, pulse oximetry, re-evaluation of patient's condition and review of old charts      Critical Care performed: yes ____________________________________________   INITIAL IMPRESSION / ASSESSMENT AND PLAN / ED COURSE  Pertinent labs & imaging results that were available during my care of the patient were reviewed by me and considered in my medical decision making (see chart for  details).   DDX: Dehydration, sepsis, pna, uti, hypoglycemia, cva, drug effect, withdrawal, encephalitis   Ercole Georg is  a 57 y.o. who presents to the ED with sx as described above.  Patient is AFVSS in ED. Exam as above. Given current presentation have considered the above differential.  Neuro exam as described above.  Concerning for toxic/metabolic or psychiatric illness.  The patient will be placed on continuous pulse oximetry and telemetry for monitoring.  Laboratory evaluation will be sent to evaluate for the above complaints.  Even recent and imaging without any trauma I do not feel that emergent CT imaging of the head is clinically indicated at this time.  Clinical Course as of Nov 08 644  Wed Nov 08, 2017  0421 Patient is escalating becoming more agitated is reaching out for and his wife checks in the room.  Speaking to persons in the room as well.  Does appear acutely psychotic.   [PR]  [phone] Patient with improvement in agitation given dose of IV Benadryl.  Still having these intermittent tremors.  Foley catheter inserted.  Still awaiting UDS.  Probable toxic or metabolic encephalopathy.  Given recent negative MRI I do not feel that emergent MRI indicated at this point but I do feel patient should be admitted to the hospital for repeat EEG is partial seizures remains on differential.  Discussed case with hospitalist who agrees to admit patient for further evaluation.   [PR]  [phone] Patient tested positive for TCAs again.  Discussed with poison control and most likely secondary to muscle relaxant Flexeril.   [PR]    Clinical Course User Index [PR] Willy Eddy, MD     As part of my medical decision making, I reviewed the following data within the electronic MEDICAL RECORD NUMBER Nursing notes reviewed and incorporated, Labs reviewed, notes from prior ED visits and [place] Controlled Substance Database   ____________________________________________   FINAL CLINICAL IMPRESSION(S) / ED  DIAGNOSES  Final diagnoses:  Altered mental status, unspecified altered mental status type  Hallucinations      NEW MEDICATIONS STARTED DURING THIS VISIT:  New Prescriptions   No medications on file     Note:  This document was prepared using Dragon voice recognition software and may include unintentional dictation errors.    Willy Eddy, MD 11/08/17 [phone]

## 2017-11-08 NOTE — ED Notes (Signed)
Pt diaphoretic. Responds to painful stimuli. Dr Roxan Hockeyrobinson notified.

## 2017-11-08 NOTE — ED Notes (Signed)
Wife reports pt has been having jerking type movements for a while and that so bad she has to sleep on couch but they are getting worse. Tonight he called the police saying a man was in the house; has been having hallucinations.

## 2017-11-08 NOTE — Procedures (Signed)
ELECTROENCEPHALOGRAM REPORT   Patient: Dennis Pineda       Room #: 243A-AA EEG No. ID: [id number] Age: 57 y.o.        Sex: male Referring Physician: Renae GlossWieting Report Date:  11/08/2017        Interpreting Physician: Thana FarrEYNOLDS, Sidrah Harden  History: Dennis Pineda is an 57 y.o. male with episodes of altered mental status  Medications:  Zithromax, Insulin, Zosyn  Conditions of Recording:  This is a 21 channel routine scalp EEG performed with bipolar and monopolar montages arranged in accordance to the international 10/20 system of electrode placement. One channel was dedicated to EKG recording.  The patient is in the awake, drowsy and asleep states.  Description:   The patient spends the majority of the recording in drowse and sleep.  The patient drowses and exhibits a background that is slow and irregular with low voltage theta and beta activity noted.     The patient goes in to a light sleep with symmetrical sleep spindles, vertex central sharp transients and irregular slow activity.   The patient requires extensive stimulation but does achieve an alpha posterior background rhythm briefly.   No epileptiform activity is noted.  Hyperventilation and intermittent photic stimulation were not performed.   IMPRESSION: This is a normal electroencephalogram, drowsy and asleep. There are no focal lateralizing or epileptiform features.   Thana FarrLeslie Khadeja Abt, MD Neurology [phone] 11/08/2017, 2:37 PM

## 2017-11-08 NOTE — ED Triage Notes (Addendum)
Pt to triage via w/c; pt accomp by SO who reports AMS since yesterday that he called police yesterday stating he seen someone in the house; denies any recent illness; pt with stuttering speech noted, very restless, jerky movements; st hx of same with no dx approx month ago

## 2017-11-08 NOTE — Consult Note (Signed)
Reason for Consult: Altered mental status Referring Physician:   CC: Confusion, agitation, and visual hallucination  HPI: Dennis Pineda is an 57 y.o. male with history of depression, diabetes mellitus, hypertension, presenting to the ED with worsening hallucinations, agitation, altered mental status.  Patient is currently lethargic and confused so history is obtained from wife who is currently at the bedside.  Patient's wife states that she brought patient to the ED due to increased hallucinations and disorganized thinking, she states that patient called 911 thinking that there was an intruder in the house.  She states that this is a very new episode for him so she got concerned that something else was going on.  Patient's wife reports that over the last year patient's cognition and has continued to decline intermittent confusion and abnormal behavior.  She states that patient has worsening REM sleep behavior disorder, he is talking excessively in his sleep and acting out his dreams.  She also states that he has been snoring excessively at night the point that she has to sleep in another room.  Per wife, patient is restless, agitated and walks around like a zombie most of the time.  He is sometimes confused and disoriented to place, asking the wife who she is, where aware she is going all the time and where he is.  He requires constant monitoring, prompting and re-orientation.  She is wife is concerned that this behavior is gradually getting worse.  He had an MRI of the brain in July/29/2019 which was normal with no acute intracranial abnormality.  Past Medical History:  Diagnosis Date  . Arthritis    gout  . Depression   . Diabetes mellitus without complication (HCC)   . Hypertension     Past Surgical History:  Procedure Laterality Date  . APPENDECTOMY    . KNEE SURGERY    . ROTATOR CUFF REPAIR    . SHOULDER SURGERY      Family History  Problem Relation Age of Onset  . Diabetes Mother    . Cancer Father        Liver  . Diabetes Father   . Diabetes Sister   . Diabetes Maternal Grandmother     Social History:  reports that he has quit smoking. His smoking use included cigarettes. He has never used smokeless tobacco. He reports that he does not drink alcohol or use drugs.  No Known Allergies  Medications:  I have reviewed the patient's current medications. Prior to Admission:  Medications Prior to Admission  Medication Sig Dispense Refill Last Dose  . allopurinol (ZYLOPRIM) 100 MG tablet Take 1 tablet (100 mg total) by mouth daily. 30 tablet 1   . buprenorphine-naloxone (SUBOXONE) 8-2 mg SUBL SL tablet Place 1 tablet under the tongue daily.     . cyclobenzaprine (FLEXERIL) 10 MG tablet Take 10 mg by mouth 3 (three) times daily as needed for muscle spasms.     Marland Kitchen. escitalopram (LEXAPRO) 20 MG tablet Take 20 mg by mouth daily.  5 09/18/2017 at Unknown time  . meloxicam (MOBIC) 15 MG tablet Take 1 tablet (15 mg total) by mouth daily. 30 tablet 0   . methocarbamol (ROBAXIN) 500 MG tablet Take 1 tablet (500 mg total) by mouth at bedtime. 30 tablet 0   . Naphazoline HCl (CLEAR EYES OP) Place 2 drops into both eyes daily as needed (for red eyes).   PRN at PRN  . naproxen (NAPROSYN) 500 MG tablet Take 1 tablet (500 mg total) by mouth  2 (two) times daily with a meal. 30 tablet 0 09/18/2017 at Unknown time  . predniSONE (DELTASONE) 10 MG tablet Take 1 tablet (10 mg total) by mouth daily. 42 tablet 0   . sertraline (ZOLOFT) 100 MG tablet Take 1 tablet (100 mg total) by mouth daily. 30 tablet 1 09/18/2017 at Unknown time   Scheduled: . azithromycin  500 mg Oral Daily   Followed by  . [START ON 11/09/2017] azithromycin  250 mg Oral Daily  . enoxaparin (LOVENOX) injection  40 mg Subcutaneous Q24H  . insulin aspart  0-15 Units Subcutaneous TID WC  . insulin aspart  0-5 Units Subcutaneous QHS    ROS: History obtained from the patient   General ROS: negative for - chills, fatigue,  fever, night sweats, weight gain or weight loss Psychological ROS: negative for - behavioral disorder, hallucinations, memory difficulties, mood swings or suicidal ideation Ophthalmic ROS: negative for - blurry vision, double vision, eye pain or loss of vision ENT ROS: negative for - epistaxis, nasal discharge, oral lesions, sore throat, tinnitus or vertigo Allergy and Immunology ROS: negative for - hives or itchy/watery eyes Hematological and Lymphatic ROS: negative for - bleeding problems, bruising or swollen lymph nodes Endocrine ROS: negative for - galactorrhea, hair pattern changes, polydipsia/polyuria or temperature intolerance Respiratory ROS: negative for - cough, hemoptysis, shortness of breath or wheezing Cardiovascular ROS: negative for - chest pain, dyspnea on exertion, edema or irregular heartbeat Gastrointestinal ROS: negative for - abdominal pain, diarrhea, hematemesis, nausea/vomiting or stool incontinence Genito-Urinary ROS: negative for - dysuria, hematuria, incontinence or urinary frequency/urgency Musculoskeletal ROS: negative for - joint swelling or muscular weakness Neurological ROS: as noted in HPI Dermatological ROS: negative for rash and skin lesion changes  Physical Exam   Vitals Blood pressure 108/75, pulse 62, temperature 98.9 F (37.2 C), temperature source Oral, resp. rate 18, height 5\' 10"  (1.778 m), weight 83.4 kg, SpO2 94 %.    HEENT-  Normocephalic, no lesions, without obvious abnormality.  Normal external eye and conjunctiva.  Normal TM's bilaterally.  Normal auditory canals and external ears. Normal external nose, mucus membranes and septum.  Normal pharynx. Cardiovascular- S1, S2 normal, pulses palpable throughout   Lungs- chest clear, no wheezing, rales, normal symmetric air entry Abdomen- soft, non-tender; bowel sounds normal; no masses,  no organomegaly Extremities- no edema Lymph-no adenopathy palpable Musculoskeletal-no joint tenderness, deformity  or swelling Skin-warm and dry, no hyperpigmentation, vitiligo, or suspicious lesions  Neurological Exam Mental Status: Lethargic.  Does not  Follow commands.  Just moans but does not speak intelligibly.   Cranial Nerves: II: Eyes closed.  Unable to test III,IV, VI: Unable to test V,VII: grimace symmetric VIII: Unable to test IX,X:Unable to test XI: Unable to test XII: Unable to test Motor: Moves all extremities when agitated.  No focal weakness noted.   Sensory: Responds to noxious stimuli throughout Deep Tendon Reflexes:Unable to test Plantars: Unable to test Cerebellar: Unable to test Gait: not tested due to safety concerns   Data Reviewed  Laboratory Studies:   Basic Metabolic Panel: Recent Labs  Lab 11/08/17 0317  NA 132*  K 4.5  CL 96*  CO2 24  GLUCOSE 351*  BUN 25*  CREATININE 0.94  CALCIUM 9.7    Liver Function Tests: Recent Labs  Lab 11/08/17 0317  AST 24  ALT 19  ALKPHOS 93  BILITOT 0.6  PROT 7.7  ALBUMIN 4.3   No results for input(s): LIPASE, AMYLASE in the last 168 hours. Recent Labs  Lab 11/08/17 0317  AMMONIA 24    CBC: Recent Labs  Lab 11/08/17 0317  WBC 6.1  HGB 14.0  HCT 40.2  MCV 87.3  PLT 279    Cardiac Enzymes: No results for input(s): CKTOTAL, CKMB, CKMBINDEX, TROPONINI in the last 168 hours.  BNP: Invalid input(s): POCBNP  CBG: Recent Labs  Lab 11/08/17 0310 11/08/17 0517 11/08/17 0752  GLUCAP 342* 342* 247*    Microbiology: Results for orders placed or performed during the hospital encounter of 09/14/14  MRSA PCR Screening     Status: None   Collection Time: 09/14/14 10:54 AM  Result Value Ref Range Status   MRSA by PCR NEGATIVE NEGATIVE Final    Comment:        The GeneXpert MRSA Assay (FDA approved for NASAL specimens only), is one component of a comprehensive MRSA colonization surveillance program. It is not intended to diagnose MRSA infection nor to guide or monitor treatment for MRSA  infections.     Coagulation Studies: No results for input(s): LABPROT, INR in the last 72 hours.  Urinalysis:  Recent Labs  Lab 11/08/17 0519  COLORURINE STRAW*  LABSPEC 1.014  PHURINE 5.0  GLUCOSEU >=500*  HGBUR NEGATIVE  BILIRUBINUR NEGATIVE  KETONESUR NEGATIVE  PROTEINUR NEGATIVE  NITRITE NEGATIVE  LEUKOCYTESUR NEGATIVE    Lipid Panel:  No results found for: CHOL, TRIG, HDL, CHOLHDL, VLDL, LDLCALC  HgbA1C:  Lab Results  Component Value Date   HGBA1C 6.5 10/29/2013    Urine Drug Screen:      Component Value Date/Time   LABOPIA NONE DETECTED 11/08/2017 0519   COCAINSCRNUR NONE DETECTED 11/08/2017 0519   LABBENZ NONE DETECTED 11/08/2017 0519   AMPHETMU NONE DETECTED 11/08/2017 0519   THCU NONE DETECTED 11/08/2017 0519   LABBARB NONE DETECTED 11/08/2017 0519    Alcohol Level: No results for input(s): ETH in the last 168 hours.  Other results: EKG: sinus rhythm at 84 bpm.  Imaging: Dg Chest Portable 1 View  Result Date: 11/08/2017 CLINICAL DATA:  Altered mental status and weakness since yesterday. EXAM: PORTABLE CHEST 1 VIEW COMPARISON:  09/18/2017 FINDINGS: Shallow inspiration. Cardiac enlargement without vascular congestion. No edema or consolidation. Prominent aortic shadow similar to previous study probably representing a dilated aortic arch. Hazy opacity in the left upper lung silhouetting the upper mediastinum possibly representing focal pneumonia. Atelectasis in the lung bases similar to before. No pleural effusions. No pneumothorax. IMPRESSION: Shallow inspiration with atelectasis in the lung bases. 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 Hazy opacity in the left upper lung may represent focal pneumonia. Electronically Signed   By: Burman Nieves M.D.   On: 11/08/2017 06:49     Assessment/Plan: 58 year old male admitted with altered mental status.  Patient unable to give history.  All history provided by wife as on last visit on July.  Wife  gives a totally different account of history today.  Unclear which history is more accurate.  Previous work up including MRI and EEG were unremarkable.  Patient was discharged at baseline.  Outpatient follow up was not attended.  Patient with history of these episodes resolving spontaneously after a few days.  There is concern for possible frontal lobe seizures.  Last EEG not done while patient was altered.  Will repeat on this visit.    Recommendations: 1.  EEG stat 2.  Avoid benzos  11/08/2017, 8:26 AM

## 2017-11-09 LAB — BASIC METABOLIC PANEL
ANION GAP: 7 (ref 5–15)
BUN: 18 mg/dL (ref 6–20)
CO2: 27 mmol/L (ref 22–32)
Calcium: 8.5 mg/dL — ABNORMAL LOW (ref 8.9–10.3)
Chloride: 104 mmol/L (ref 98–111)
Creatinine, Ser: 0.83 mg/dL (ref 0.61–1.24)
Glucose, Bld: 258 mg/dL — ABNORMAL HIGH (ref 70–99)
POTASSIUM: 3.8 mmol/L (ref 3.5–5.1)
SODIUM: 138 mmol/L (ref 135–145)

## 2017-11-09 LAB — HEMOGLOBIN A1C
Hgb A1c MFr Bld: 12.3 % — ABNORMAL HIGH (ref 4.8–5.6)
MEAN PLASMA GLUCOSE: 306 mg/dL

## 2017-11-09 LAB — GLUCOSE, CAPILLARY
GLUCOSE-CAPILLARY: 241 mg/dL — AB (ref 70–99)
GLUCOSE-CAPILLARY: 191 mg/dL — AB (ref 70–99)
Glucose-Capillary: 98 mg/dL (ref 70–99)
Glucose-Capillary: 181 mg/dL — ABNORMAL HIGH (ref 70–99)

## 2017-11-09 LAB — RPR: RPR: NONREACTIVE

## 2017-11-09 MED ORDER — HALOPERIDOL LACTATE 5 MG/ML IJ SOLN: 1.0000 mg | Freq: Four times a day (QID) | INTRAMUSCULAR | Status: DC | PRN

## 2017-11-09 MED ORDER — TRAZODONE HCL 50 MG PO TABS: 50.0000 mg | ORAL_TABLET | Freq: Every evening | ORAL | Status: DC | PRN

## 2017-11-09 MED ORDER — SODIUM CHLORIDE 0.9 % IV SOLN
INTRAVENOUS | Status: DC | PRN
  Administered 2017-11-09: 500 mL via INTRAVENOUS

## 2017-11-09 MED ORDER — ROPINIROLE HCL 1 MG PO TABS
0.5000 mg | ORAL_TABLET | Freq: Every day | ORAL | Status: DC
  Administered 2017-11-09 – 2017-11-10 (×2): 0.5 mg via ORAL
  Filled 2017-11-09 (×2): qty 1

## 2017-11-09 NOTE — Progress Notes (Signed)
Inpatient Diabetes Program Recommendations  AACE/ADA: New Consensus Statement on Inpatient Glycemic Control (2019)  Target Ranges:  Prepandial:   less than 140 mg/dL      Peak postprandial:   less than 180 mg/dL (1-2 hours)      Critically ill patients:  140 - 180 mg/dL   Results for Tomi BambergerCARCUEVA, Nyzaiah (MRN [id number]) as of 11/09/2017 14:42  Ref. Range 11/08/2017 07:52 11/08/2017 11:54 11/08/2017 16:49 11/08/2017 21:12 11/09/2017 09:54 11/09/2017 12:51  Glucose-Capillary Latest Ref Range: 70 - 99 mg/dL 409247 (H) 811108 (H) 914131 (H)  Novolog 2 units 176 (H) 241 (H)  Novolog 5 units 98   Results for Tomi BambergerCARCUEVA, Gray (MRN [id number]) as of 11/09/2017 14:42  Ref. Range 09/19/2017 03:49 11/08/2017 03:17 11/09/2017 04:24  Glucose Latest Ref Range: 70 - 99 mg/dL 086110 (H) 578351 (H) 469258 (H)   Results for Tomi BambergerCARCUEVA, Elai (MRN [id number]) as of 11/09/2017 14:42  Ref. Range 11/08/2017 03:17  Hemoglobin A1C Latest Ref Range: 4.8 - 5.6 % 12.3 (H)   Review of Glycemic Control  Diabetes history: DM2 Outpatient Diabetes medications: None currently but has Metformin 500 mg at home which was prescribed BID Current orders for Inpatient glycemic control: Novolog 0-15 units TID with meals, Novolog 0-5 units QHS  Inpatient Diabetes Program Recommendations:  HgbA1C: A1C 12.3% on 11/08/17 indicating an average glucose of 306 mg/dl over the past 2-3 months. Noted patient recently in ER on 09/26/17 for back pain and was prescribed Prednisone taper over 12 days. Recent Prednisone taper is contributing to elevated A1C.  NOTE: Spoke with patient about diabetes and home regimen for diabetes control. Patient reports that he does not have a PCP currently but he notes he has [employer] insurance. Patient states that he has not been taking any medications for DM "for a very long time" but he has Metformin 500 mg at home which he was prescribed to take BID.  Patient reports that he has all needed testing supplies to monitor glucose but notes that he  has not been checking glucose at home.  Inquired about prior A1C and patient reports that he does not recall his last A1C value. Discussed A1C results (12.3% on 11/08/17) and explained that his current A1C indicates an average glucose of 306 mg/dl over the past 2-3 months. Discussed glucose and A1C goals. Discussed importance of checking CBGs and maintaining good CBG control to prevent long-term and short-term complications. Stressed to the patient the importance of improving glycemic control to prevent further complications from uncontrolled diabetes. Discussed impact of nutrition, exercise, stress, sickness, and medications on diabetes control. Inquired about recent Prednisone prescribed in ER on 09/26/17. Patient confirms that he took all the Prednisone he was prescribed (over 12 days). Explained that recent steroid use is contributing to elevated A1C results.  Encouraged patient to check his glucose at least 1 time per day and to establish care with PCP for follow up. Informed patient CM consult would be ordered to help with follow up care.  Patient verbalized understanding of information discussed and he states that he has no further questions at this time related to diabetes. Patient inquired about whether he has been receiving his suboxone while inpatient because he feels as if he is having withdrawal symptoms. Informed patient I would make RN aware and have RN let him know if suboxone is prescribed or not so it can be discussed further with attending MD.  Sherron MondaySpoke with Brett CanalesSteve, RN regarding patient inquiry about suboxone and that patient reports he feels he  is having symptoms of withdrawal from suboxone.  Thanks, Orlando Penner, RN, MSN, CDE Diabetes Coordinator Inpatient Diabetes Program [phone] (Team Pager)

## 2017-11-09 NOTE — Progress Notes (Signed)
Subjective: Improved but remains lethargic.  Per nursing staff no reports of agitation, restlessness, or hallucinations.  He states that he has a slight headache today otherwise no other complaints.  Objective: Current vital signs: BP 130/84 (BP Location: Left Arm)   Pulse 68   Temp 98.7 F (37.1 C) (Oral)   Resp 19   Ht 5\' 10"  (1.778 m)   Wt 83.4 kg   SpO2 95%   BMI 26.39 kg/m  Vital signs in last 24 hours: Temp:  [98.7 F (37.1 C)-98.8 F (37.1 C)] 98.7 F (37.1 C) (09/19 0915) Pulse Rate:  [62-69] 68 (09/19 0915) Resp:  [16-19] 19 (09/19 0915) BP: (121-130)/(84-93) 130/84 (09/19 0915) SpO2:  [95 %-98 %] 95 % (09/19 0915)  Intake/Output from previous day: 09/18 0701 - 09/19 0700 In: 1978.4 [I.V.:1614.2; IV Piggyback:364.2] Out: 2300 [Urine:2300] Intake/Output this shift: Total I/O In: -  Out: 700 [Urine:700] Nutritional status:  Diet Order            Diet heart healthy/carb modified Room service appropriate? Yes; Fluid consistency: Thin  Diet effective now              Physical Exam   Vitals Blood pressure 130/84, pulse 68, temperature 98.7 F (37.1 C), temperature source Oral, resp. rate 19, height 5\' 10"  (1.778 m), weight 83.4 kg, SpO2 95 %.  Neurological Exam   Mental Status: Lethargic but easily aroused.  Follows commands.  Speech fluent.   Cranial Nerves: II: Discs flat bilaterally; Visual fields grossly normal, pupils equal, round, reactive to light and accommodation III,IV, VI: ptosis not present, extra-ocular motions intact bilaterally V,VII: smile symmetric, facial light touch sensation intact VIII: hearing normal bilaterally IX,X: gag reflex present XI: bilateral shoulder shrug XII: midline tongue extension Motor: 5/5 throughout Sensory: Pinprick and light touch intact bilaterally   Lab Results: Basic Metabolic Panel: Recent Labs  Lab 11/08/17 0317 11/09/17 0424  NA 132* 138  K 4.5 3.8  CL 96* 104  CO2 24 27  GLUCOSE 351* 258*   BUN 25* 18  CREATININE 0.94 0.83  CALCIUM 9.7 8.5*    Liver Function Tests: Recent Labs  Lab 11/08/17 0317  AST 24  ALT 19  ALKPHOS 93  BILITOT 0.6  PROT 7.7  ALBUMIN 4.3   No results for input(s): LIPASE, AMYLASE in the last 168 hours. Recent Labs  Lab 11/08/17 0317  AMMONIA 24    CBC: Recent Labs  Lab 11/08/17 0317  WBC 6.1  HGB 14.0  HCT 40.2  MCV 87.3  PLT 279    Cardiac Enzymes: No results for input(s): CKTOTAL, CKMB, CKMBINDEX, TROPONINI in the last 168 hours.  Lipid Panel: No results for input(s): CHOL, TRIG, HDL, CHOLHDL, VLDL, LDLCALC in the last 168 hours.  CBG: Recent Labs  Lab 11/08/17 1154 11/08/17 1649 11/08/17 2112 11/09/17 0954 11/09/17 1251  GLUCAP 108* 131* 176* 241* 98    Microbiology: Results for orders placed or performed during the hospital encounter of 09/14/14  MRSA PCR Screening     Status: None   Collection Time: 09/14/14 10:54 AM  Result Value Ref Range Status   MRSA by PCR NEGATIVE NEGATIVE Final    Comment:        The GeneXpert MRSA Assay (FDA approved for NASAL specimens only), is one component of a comprehensive MRSA colonization surveillance program. It is not intended to diagnose MRSA infection nor to guide or monitor treatment for MRSA infections.     Coagulation Studies: No results for  input(s): LABPROT, INR in the last 72 hours.  Imaging: Dg Chest Portable 1 View  Result Date: 11/08/2017 CLINICAL DATA:  Altered mental status and weakness since yesterday. EXAM: PORTABLE CHEST 1 VIEW COMPARISON:  09/18/2017 FINDINGS: Shallow inspiration. Cardiac enlargement without vascular congestion. No edema or consolidation. Prominent aortic shadow similar to previous study probably representing a dilated aortic arch. Hazy opacity in the left upper lung silhouetting the upper mediastinum possibly representing focal pneumonia. Atelectasis in the lung bases similar to before. No pleural effusions. No pneumothorax.  IMPRESSION: Shallow inspiration with atelectasis in the lung bases. 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 Hazy opacity in the left upper lung may represent focal pneumonia. Electronically Signed   By: Burman Nieves M.D.   On: 11/08/2017 06:49    Medications:  I have reviewed the patient's current medications. Prior to Admission:  Medications Prior to Admission  Medication Sig Dispense Refill Last Dose  . allopurinol (ZYLOPRIM) 100 MG tablet Take 1 tablet (100 mg total) by mouth daily. (Patient not taking: Reported on 11/08/2017) 30 tablet 1 Not Taking at Unknown time  . buprenorphine-naloxone (SUBOXONE) 8-2 mg SUBL SL tablet Place 1 tablet under the tongue daily.     . cyclobenzaprine (FLEXERIL) 10 MG tablet Take 10 mg by mouth 3 (three) times daily as needed for muscle spasms.   Not Taking at Unknown time  . escitalopram (LEXAPRO) 20 MG tablet Take 20 mg by mouth daily.  5 09/18/2017 at Unknown time  . meloxicam (MOBIC) 15 MG tablet Take 1 tablet (15 mg total) by mouth daily. (Patient not taking: Reported on 11/08/2017) 30 tablet 0 Not Taking at Unknown time  . methocarbamol (ROBAXIN) 500 MG tablet Take 1 tablet (500 mg total) by mouth at bedtime. 30 tablet 0   . Naphazoline HCl (CLEAR EYES OP) Place 2 drops into both eyes daily as needed (for red eyes).   Not Taking at Unknown time  . naproxen (NAPROSYN) 500 MG tablet Take 1 tablet (500 mg total) by mouth 2 (two) times daily with a meal. (Patient not taking: Reported on 11/08/2017) 30 tablet 0 Not Taking at Unknown time  . predniSONE (DELTASONE) 10 MG tablet Take 1 tablet (10 mg total) by mouth daily. (Patient not taking: Reported on 11/08/2017) 42 tablet 0 Not Taking at Unknown time  . sertraline (ZOLOFT) 100 MG tablet Take 1 tablet (100 mg total) by mouth daily. (Patient not taking: Reported on 11/08/2017) 30 tablet 1 Not Taking at Unknown time   Scheduled: . enoxaparin (LOVENOX) injection  40 mg Subcutaneous Q24H  . escitalopram   20 mg Oral Daily  . insulin aspart  0-15 Units Subcutaneous TID WC  . insulin aspart  0-5 Units Subcutaneous QHS  . rOPINIRole  0.5 mg Oral QHS     Patient seen and examined.  Clinical course and management discussed.  Necessary edits performed.  I agree with the above.  Assessment and plan of care developed and discussed below.   Assessment/plan: 57 year old male admitted with altered mental status.  Patient improving.  No further jerking activity noted on examination.  Suspect patient will continue to improve as he did on last evaluation.  B12, folate, RPR and TSH are normal.    Recommendations: 1. Follow up with neurology on an outpatient basis   This patient was staffed with Dr. Verlon Au, Thad Ranger who personally evaluated patient, reviewed documentation and agreed with assessment and plan of care as above.  Webb Silversmith, DNP, FNP-BC Board certified  Nurse Practitioner Neurology Department      Thana Farr, MD Neurology [phone]  11/09/2017  2:25 PM    LOS: 1 day

## 2017-11-09 NOTE — Progress Notes (Signed)
Patient ID: Dennis Pineda, male   DOB: Mar 28, 1960, 57 y.o.   MRN: [id number]  [facility] PROGRESS NOTE  Dennis Pineda WUJ:[id number] DOB: Mar 28, 1960 DOA: 11/08/2017 PCP: Patient, No Pcp Per  HPI/Subjective: Patient has periods of clarity and periods of confusion.  As per wife having some hallucinations and saying some mild things.  At first I could not communicate with him today.  After little while I was able to focus him on how he was feeling.  He has had some terrible jerking pains especially at night when he sleeping.  He has been talking in his sleep.  Wife states he does not sleep well at night.  Patient states that he has an electric shock type of feeling that goes through his body.  Wife states that he abuses medications and may have taken a lot of Benadryl.  Objective: Vitals:   11/09/17 0328 11/09/17 0915  BP: (!) 121/93 130/84  Pulse: 62 68  Resp: 16 19  Temp: 98.8 F (37.1 C) 98.7 F (37.1 C)  SpO2: 98% 95%    Intake/Output Summary (Last 24 hours) at 11/09/2017 1547 Last data filed at 11/09/2017 1035 Gross per 24 hour  Intake 1978.4 ml  Output 3000 ml  Net -1021.6 ml   Filed Weights   11/08/17 0301 11/08/17 0723 11/09/17 1427  Weight: 77.1 kg 83.4 kg 83.6 kg    ROS: Review of Systems  Respiratory: Negative for shortness of breath.   Cardiovascular: Negative for chest pain.  Gastrointestinal: Negative for abdominal pain.   Exam: Physical Exam  Constitutional: He appears lethargic.  HENT:  Nose: No mucosal edema.  Mouth/Throat: No oropharyngeal exudate or posterior oropharyngeal edema.  Eyes: Pupils are equal, round, and reactive to light. Conjunctivae and lids are normal.  Neck: No JVD present. Carotid bruit is not present. No edema present. No thyroid mass and no thyromegaly present.  Cardiovascular: S1 normal and S2 normal. Exam reveals no gallop.  No murmur heard. Pulses:      Dorsalis pedis pulses are 2+ on the right side, and 2+ on the left side.   Respiratory: No respiratory distress. He has no wheezes. He has no rhonchi. He has no rales.  GI: Soft. Bowel sounds are normal. There is no tenderness.  Musculoskeletal:       Right ankle: He exhibits no swelling.       Left ankle: He exhibits no swelling.  Lymphadenopathy:    He has no cervical adenopathy.  Neurological: He appears lethargic.  Did follow all commands.  Was able to move all of his extremities.  Skin: Skin is warm. No rash noted. Nails show no clubbing.  Psychiatric:  Periods of confusion followed by periods of clarity and then periods of confusion.      Data Reviewed: Basic Metabolic Panel: Recent Labs  Lab 11/08/17 0317 11/09/17 0424  NA 132* 138  K 4.5 3.8  CL 96* 104  CO2 24 27  GLUCOSE 351* 258*  BUN 25* 18  CREATININE 0.94 0.83  CALCIUM 9.7 8.5*   Liver Function Tests: Recent Labs  Lab 11/08/17 0317  AST 24  ALT 19  ALKPHOS 93  BILITOT 0.6  PROT 7.7  ALBUMIN 4.3    Recent Labs  Lab 11/08/17 0317  AMMONIA 24   CBC: Recent Labs  Lab 11/08/17 0317  WBC 6.1  HGB 14.0  HCT 40.2  MCV 87.3  PLT 279   CBG: Recent Labs  Lab 11/08/17 1154 11/08/17 1649 11/08/17 2112 11/09/17 [id number]  11/09/17 1251  GLUCAP 108* 131* 176* 241* 98    Studies: Dg Chest Portable 1 View  Result Date: 11/08/2017 CLINICAL DATA:  Altered mental status and weakness since yesterday. EXAM: PORTABLE CHEST 1 VIEW COMPARISON:  09/18/2017 FINDINGS: Shallow inspiration. Cardiac enlargement without vascular congestion. No edema or consolidation. Prominent aortic shadow similar to previous study probably representing a dilated aortic arch. Hazy opacity in the left upper lung silhouetting the upper mediastinum possibly representing focal pneumonia. Atelectasis in the lung bases similar to before. No pleural effusions. No pneumothorax. IMPRESSION: Shallow inspiration with atelectasis in the lung bases. 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 Hazy opacity in  the left upper lung may represent focal pneumonia. Electronically Signed   By: Burman Nieves M.D.   On: 11/08/2017 06:49    Scheduled Meds: . enoxaparin (LOVENOX) injection  40 mg Subcutaneous Q24H  . escitalopram  20 mg Oral Daily  . insulin aspart  0-15 Units Subcutaneous TID WC  . insulin aspart  0-5 Units Subcutaneous QHS  . rOPINIRole  0.5 mg Oral QHS   Continuous Infusions: . sodium chloride 75 mL/hr at 11/09/17 0921  . sodium chloride 500 mL (11/09/17 0325)  . azithromycin 500 mg (11/09/17 0916)  . piperacillin-tazobactam (ZOSYN)  IV 3.375 g (11/09/17 1114)    Assessment/Plan:  1. Acute delirium.  PRN Haldol.  Try to get sleeping with trazodone at night.  Wondering if there could be an element of restless leg syndrome I will try a low-dose Requip at night. 2. 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 Depression on Lexapro.  Wife states patient also takes Zoloft but I will hold back as its in the same class as the Lexapro 4. Type 2 diabetes mellitus with a hemoglobin A1c of 12.3.  His sugars here do not go along with that.  But as per wife he does eat a lot of sweets as outpatient.  I am scared to put him on insulin at this point.  May be able to do oral medications upon discharge home. 5. Possible pneumonia seen on x-ray.  On Zithromax and Zosyn 6. Discontinued Foley catheter  Code Status:     Code Status Orders  (From admission, onward)         Start     Ordered   11/08/17 0721  Full code  Continuous     11/08/17 0720        Code Status History    Date Active Date Inactive Code Status Order ID Comments User Context   09/19/2017 0235 09/20/2017 1517 Full Code [id number]  Oralia Manis, MD Inpatient   09/14/2014 1041 09/15/2014 1726 Full Code [id number]  Houston Siren, MD Inpatient     Family Communication: Wife at the bedside this morning Disposition Plan: To be  determined  Consultants:  Neurology  Psychiatry  Antibiotics:  Zithromax  Zosyn  Time spent: 35 minutes  Bijou Easler [employer]

## 2017-11-09 NOTE — Evaluation (Signed)
Physical Therapy Evaluation Patient Details Name: Dennis BambergerManuel Pineda MRN: [id number] DOB: 02-27-1960 Today's Date: 11/09/2017   History of Present Illness  Pt is a 57 y.o. male with history of depression, diabetes mellitus, hypertension, presenting to the ED with worsening hallucinations, agitation, and altered mental status. Patient's wife in ED indicated that over the last year patient's cognition has continued to decline with intermittent confusion and abnormal behavior. No current significant neurological findings have been found since admission.   Clinical Impression  Upon arrival, pt lethargic and sleeping but able to respond to basic questions when asked. Pt oriented to name, year, month, and place but cannot recall why he is in the hospital. Noticeable episodes between clear and concise consciousness versus confused and wandering thoughts. After removal of blankets and encouragement to sit up, pt became significantly more alert and cooperative. Pt able to perform bed mobility and transfers independently once alerted to stand up. Pt ambulated 320 feet with only CGA for safety and initial 2 UE hold on IV pole for stability. Pt strength all WFL with good balance also noted in sitting and standing. Pt does not require PT services at this time and will be safe to return home once medically cleared. Will complete PT order at this time, please re consult PT if pt status changes and acute PT needs are identified.     Follow Up Recommendations No PT follow up    Equipment Recommendations       Recommendations for Other Services       Precautions / Restrictions Precautions Precautions: Fall Restrictions Weight Bearing Restrictions: No      Mobility  Bed Mobility Overal bed mobility: Independent                Transfers Overall transfer level: Independent Equipment used: None                Ambulation/Gait Ambulation/Gait assistance: Min guard(CGA for safety) Gait Distance  (Feet): 320 Feet Assistive device: None(80 feet with 2 hands on IV pole, remainder with no AD)   Gait velocity: Normal gait speed   General Gait Details: No deviations in gait, slightly guarded initially, VC to relax arms  Stairs            Wheelchair Mobility    Modified Rankin (Stroke Patients Only)       Balance Overall balance assessment: Independent(Pt able to balance safely during ambulation with head turns, balancing in the bathroom with reaching outside of BOS, no LOB or unsteadiness noted)                                           Pertinent Vitals/Pain Pain Assessment: Faces Faces Pain Scale: Hurts a little bit Pain Location: Pt reporting increased back pain with walking but reports this is typical.  Pain Intervention(s): Monitored during session    Home Living Family/patient expects to be discharged to:: Private residence Living Arrangements: Spouse/significant other Available Help at Discharge: Family Type of Home: House Home Access: Stairs to enter   Secretary/administratorntrance Stairs-Number of Steps: 4 Home Layout: One level Home Equipment: None Additional Comments: Pt initially indicating he uses an AD but then saying that he was confused and does not.     Prior Function Level of Independence: Independent               Hand Dominance  Extremity/Trunk Assessment   Upper Extremity Assessment Upper Extremity Assessment: Overall WFL for tasks assessed    Lower Extremity Assessment Lower Extremity Assessment: Overall WFL for tasks assessed       Communication   Communication: No difficulties  Cognition Arousal/Alertness: Lethargic Behavior During Therapy: (Confused, mildly impulsive) Overall Cognitive Status: History of cognitive impairments - at baseline                                 General Comments: Pt initially very lethargic, answering questions very quietly, pt confused about why he answered questions in  certain ways. Pt calm throughout session. Difficulty with history at times but then very clear at other times. More alert once seated.       General Comments      Exercises     Assessment/Plan    PT Assessment Patent does not need any further PT services  PT Problem List         PT Treatment Interventions      PT Goals (Current goals can be found in the Care Plan section)       Frequency     Barriers to discharge        Co-evaluation               AM-PAC PT "6 Clicks" Daily Activity  Outcome Measure Difficulty turning over in bed (including adjusting bedclothes, sheets and blankets)?: None Difficulty moving from lying on back to sitting on the side of the bed? : None Difficulty sitting down on and standing up from a chair with arms (e.g., wheelchair, bedside commode, etc,.)?: None Help needed moving to and from a bed to chair (including a wheelchair)?: None Help needed walking in hospital room?: None Help needed climbing 3-5 steps with a railing? : None 6 Click Score: 24    End of Session Equipment Utilized During Treatment: Gait belt Activity Tolerance: Patient tolerated treatment well Patient left: in bed;with bed alarm set;with call bell/phone within reach Nurse Communication: Mobility status(pt reporting he takes suboxin and has not been receiving it in the hospital) PT Visit Diagnosis: Muscle weakness (generalized) (M62.81)    Time: 3664-4034 PT Time Calculation (min) (ACUTE ONLY): 35 min   Charges:              Mickel Duhamel, SPT 11/09/2017, 3:57 PM

## 2017-11-09 NOTE — Consult Note (Signed)
[facility] Consult   Reason for Consult: Follow-up on yesterday's consult for this 57 year old man with altered mental status Referring Physician: Posey Pronto Patient Identification: Dennis Pineda MRN:  [id number] Principal Diagnosis: Altered mental status Diagnosis:   Patient Active Problem List   Diagnosis Date Noted  . Altered mental status [R41.82] 11/08/2017  . At risk for abuse of opiates [Z91.89] 11/08/2017  . Chronic pain syndrome [G89.4] 11/08/2017  . Syncope [R55] 09/19/2017  . Decreased responsiveness [R41.89] 09/18/2017  . Diabetes mellitus without complication ([place]) [X32.3] 05/10/2015  . Hypertension [I10] 05/10/2015  . Depression [F32.9] 05/10/2015  . Fracture of multiple ribs [S22.49XA] 05/01/2015  . Angioedema [T78.3XXA] 09/14/2014    Total Time spent with patient: 30 minutes  Subjective:   Dennis Pineda is a 57 y.o. male patient admitted with patient not able to give information.  HPI: Patient seen again today.  Wife was present and able to give more information.  Also spoke with Dr. Doy Mince and reviewed chart.  Patient is awake today but very sleepy and very little responsive.  Does not answer questions directly except to mutter to himself.  Makes no eye contact.  Wife reports that he has been like this for most of the day although from what Dr. Doy Mince is telling me it sounds like he was a little more alert earlier this morning.  Wife has no insight into what may be causing this other than to say that the patient has been "under stress".  She feels certain that he has not been abusing any substances.  No further information has come out of the workup so far.  Past Psychiatric History: Past history apparently of substance abuse but not of major mental health problems that I can identify.  He has had several of these episodes of altered mental status and delirium and from what the wife reports even more of them at home that she did not feel needed  hospital level treatment.  All without clear diagnosis.  Risk to Self:   Risk to Others:   Prior Inpatient Therapy:   Prior Outpatient Therapy:    Past Medical History:  Past Medical History:  Diagnosis Date  . Arthritis    gout  . Depression   . Diabetes mellitus without complication ([place])   . Hypertension     Past Surgical History:  Procedure Laterality Date  . APPENDECTOMY    . KNEE SURGERY    . ROTATOR CUFF REPAIR    . SHOULDER SURGERY     Family History:  Family History  Problem Relation Age of Onset  . Diabetes Mother   . Cancer Father        Liver  . Diabetes Father   . Diabetes Sister   . Diabetes Maternal Grandmother    Family Psychiatric  History: See previous note Social History:  Social History   Substance and Sexual Activity  Alcohol Use No  . Alcohol/week: 0.0 standard drinks     Social History   Substance and Sexual Activity  Drug Use No    Social History   Socioeconomic History  . Marital status: Married    Spouse name: Not on file  . Number of children: Not on file  . Years of education: Not on file  . Highest education level: Not on file  Occupational History  . Not on file  Social Needs  . Financial resource strain: Not hard at all  . Food insecurity:    Worry: Never true  Inability: Never true  . Transportation needs:    Medical: Yes    Non-medical: Yes  Tobacco Use  . Smoking status: Former Smoker    Types: Cigarettes  . Smokeless tobacco: Never Used  Substance and Sexual Activity  . Alcohol use: No    Alcohol/week: 0.0 standard drinks  . Drug use: No  . Sexual activity: Yes  Lifestyle  . Physical activity:    Days per week: 4 days    Minutes per session: 40 min  . Stress: To some extent  Relationships  . Social connections:    Talks on phone: Twice a week    Gets together: Twice a week    Attends religious service: Never    Active member of club or organization: No    Attends meetings of clubs or  organizations: Never    Relationship status: Married  Other Topics Concern  . Not on file  Social History Narrative   Lives with wife, works, drives.   Additional Social History:    Allergies:  No Known Allergies  Labs:  Results for orders placed or performed during the hospital encounter of 11/08/17 (from the past 48 hour(s))  Glucose, capillary     Status: Abnormal   Collection Time: 11/08/17  3:10 AM  Result Value Ref Range   Glucose-Capillary 342 (H) 70 - 99 mg/dL  Comprehensive metabolic panel     Status: Abnormal   Collection Time: 11/08/17  3:17 AM  Result Value Ref Range   Sodium 132 (L) 135 - 145 mmol/L   Potassium 4.5 3.5 - 5.1 mmol/L    Comment: HEMOLYSIS AT THIS LEVEL MAY AFFECT RESULT   Chloride 96 (L) 98 - 111 mmol/L   CO2 24 22 - 32 mmol/L   Glucose, Bld 351 (H) 70 - 99 mg/dL   BUN 25 (H) 6 - 20 mg/dL   Creatinine, Ser 0.94 0.61 - 1.24 mg/dL   Calcium 9.7 8.9 - 10.3 mg/dL   Total Protein 7.7 6.5 - 8.1 g/dL   Albumin 4.3 3.5 - 5.0 g/dL   AST 24 15 - 41 U/L   ALT 19 0 - 44 U/L   Alkaline Phosphatase 93 38 - 126 U/L   Total Bilirubin 0.6 0.3 - 1.2 mg/dL   GFR calc non Af Amer >60 >60 mL/min   GFR calc Af Amer >60 >60 mL/min    Comment: (NOTE) The eGFR has been calculated using the CKD EPI equation. This calculation has not been validated in all clinical situations. eGFR's persistently <60 mL/min signify possible Chronic Kidney Disease.    Anion gap 12 5 - 15    Comment: Performed at [facility], [address] [place], [place] [address]  CBC     Status: None   Collection Time: 11/08/17  3:17 AM  Result Value Ref Range   WBC 6.1 3.8 - 10.6 K/uL   RBC 4.60 4.40 - 5.90 MIL/uL   Hemoglobin 14.0 13.0 - 18.0 g/dL   HCT 40.2 40.0 - 52.0 %   MCV 87.3 80.0 - 100.0 fL   MCH 30.4 26.0 - 34.0 pg   MCHC 34.8 32.0 - 36.0 g/dL   RDW 13.4 11.5 - 14.5 %   Platelets 279 150 - 440 K/uL    Comment: Performed at [facility], [address], [place], [place] [address]  Acetaminophen level     Status: Abnormal   Collection Time: 11/08/17  3:17 AM  Result Value Ref Range  Acetaminophen (Tylenol), Serum <10 (L) 10 - 30 ug/mL    Comment: (NOTE) Therapeutic concentrations vary significantly. A range of 10-30 ug/mL  may be an effective concentration for many patients. However, some  are best treated at concentrations outside of this range. Acetaminophen concentrations >150 ug/mL at 4 hours after ingestion  and >50 ug/mL at 12 hours after ingestion are often associated with  toxic reactions. Performed at [facility], [address], [place], [place] [address]   Salicylate level     Status: None   Collection Time: 11/08/17  3:17 AM  Result Value Ref Range   Salicylate Lvl <6.1 2.8 - 30.0 mg/dL    Comment: Performed at [facility], [address] [place], [place] [address]  Ammonia     Status: None   Collection Time: 11/08/17  3:17 AM  Result Value Ref Range   Ammonia 24 9 - 35 umol/L    Comment: Performed at [facility], [address] [place], [place] [address]  Hemoglobin A1c     Status: Abnormal   Collection Time: 11/08/17  3:17 AM  Result Value Ref Range   Hgb A1c MFr Bld 12.3 (H) 4.8 - 5.6 %    Comment: (NOTE)         Prediabetes: 5.7 - 6.4         Diabetes: >6.4         Glycemic control for adults with diabetes: <7.0    Mean Plasma Glucose 306 mg/dL    Comment: (NOTE) Performed At: [facility] [place], [place] [id number] Rush Farmer MD [id number]   Folate     Status: None   Collection Time: 11/08/17  3:17 AM  Result Value Ref Range   Folate 39.0 >5.9 ng/mL    Comment: HEMOLYSIS AT THIS LEVEL MAY AFFECT RESULT Performed at [facility], [address] [place], [place] [address]   Rapid HIV screen (HIV 1/2 Ab+Ag)     Status: None   Collection Time: 11/08/17  3:17 AM  Result Value Ref Range   HIV-1 P24 Antigen - HIV24 NON REACTIVE NON  REACTIVE   HIV 1/2 Antibodies NON REACTIVE NON REACTIVE   Interpretation (HIV Ag Ab)      A non reactive test result means that HIV 1 or HIV 2 antibodies and HIV 1 p24 antigen were not detected in the specimen.    Comment: Performed at [facility], [address] [place], [place] [address]  RPR     Status: None   Collection Time: 11/08/17  3:17 AM  Result Value Ref Range   RPR Ser Ql Non Reactive Non Reactive    Comment: (NOTE) Performed At: [facility] [place], [place] [id number] Rush Farmer MD [id number]   TSH     Status: None   Collection Time: 11/08/17  3:17 AM  Result Value Ref Range   TSH 1.573 0.350 - 4.500 uIU/mL    Comment: Performed by a 3rd Generation assay with a functional sensitivity of <=0.01 uIU/mL. Performed at [facility], [address] [place], [place] [address]   Glucose, capillary     Status: Abnormal   Collection Time: 11/08/17  5:17 AM  Result Value Ref Range   Glucose-Capillary 342 (H) 70 - 99 mg/dL  Urine Drug Screen, Qualitative ([facility] only)     Status: Abnormal   Collection Time: 11/08/17  5:19 AM  Result Value Ref Range   Tricyclic, Ur Screen  DETECTED (A) NONE DETECTED   Amphetamines, Ur Screen NONE DETECTED NONE DETECTED   MDMA (Ecstasy)Ur Screen NONE DETECTED NONE DETECTED   Cocaine Metabolite,Ur [place] NONE DETECTED NONE DETECTED   Opiate, Ur Screen NONE DETECTED NONE DETECTED   Phencyclidine (PCP) Ur S NONE DETECTED NONE DETECTED   Cannabinoid 50 Ng, Ur [place] NONE DETECTED NONE DETECTED   Barbiturates, Ur Screen NONE DETECTED NONE DETECTED   Benzodiazepine, Ur Scrn NONE DETECTED NONE DETECTED   Methadone Scn, Ur NONE DETECTED NONE DETECTED    Comment: (NOTE) Tricyclics + metabolites, urine    Cutoff 1000 ng/mL Amphetamines + metabolites, urine  Cutoff 1000 ng/mL MDMA (Ecstasy), urine              Cutoff 500 ng/mL Cocaine Metabolite, urine          Cutoff 300 ng/mL Opiate + metabolites, urine         Cutoff 300 ng/mL Phencyclidine (PCP), urine         Cutoff 25 ng/mL Cannabinoid, urine                 Cutoff 50 ng/mL Barbiturates + metabolites, urine  Cutoff 200 ng/mL Benzodiazepine, urine              Cutoff 200 ng/mL Methadone, urine                   Cutoff 300 ng/mL The urine drug screen provides only a preliminary, unconfirmed analytical test result and should not be used for non-medical purposes. Clinical consideration and professional judgment should be applied to any positive drug screen result due to possible interfering substances. A more specific alternate chemical method must be used in order to obtain a confirmed analytical result. Gas chromatography / mass spectrometry (GC/MS) is the preferred confirmat ory method. Performed at [facility], [address] [place], [place] [address]   Urinalysis, Complete w Microscopic     Status: Abnormal   Collection Time: 11/08/17  5:19 AM  Result Value Ref Range   Color, Urine STRAW (A) YELLOW   APPearance CLEAR (A) CLEAR   Specific Gravity, Urine 1.014 1.005 - 1.030   pH 5.0 5.0 - 8.0   Glucose, UA >=500 (A) NEGATIVE mg/dL   Hgb urine dipstick NEGATIVE NEGATIVE   Bilirubin Urine NEGATIVE NEGATIVE   Ketones, ur NEGATIVE NEGATIVE mg/dL   Protein, ur NEGATIVE NEGATIVE mg/dL   Nitrite NEGATIVE NEGATIVE   Leukocytes, UA NEGATIVE NEGATIVE   WBC, UA 0-5 0 - 5 WBC/hpf   Bacteria, UA NONE SEEN NONE SEEN   Squamous Epithelial / LPF NONE SEEN 0 - 5    Comment: Performed at [facility], [address] [place], [place] [address]  Glucose, capillary     Status: Abnormal   Collection Time: 11/08/17  7:52 AM  Result Value Ref Range   Glucose-Capillary 247 (H) 70 - 99 mg/dL  Vitamin B12     Status: None   Collection Time: 11/08/17  8:45 AM  Result Value Ref Range   Vitamin B-12 580 180 - 914 pg/mL    Comment: (NOTE) This assay is not validated for testing neonatal or myeloproliferative syndrome specimens  for Vitamin B12 levels. Performed at [facility], [address] [place], [place] [address]   Glucose, capillary     Status: Abnormal   Collection Time: 11/08/17 11:54 AM  Result Value Ref Range   Glucose-Capillary 108 (H) 70 - 99 mg/dL  Glucose, capillary  Status: Abnormal   Collection Time: 11/08/17  4:49 PM  Result Value Ref Range   Glucose-Capillary 131 (H) 70 - 99 mg/dL  Glucose, capillary     Status: Abnormal   Collection Time: 11/08/17  9:12 PM  Result Value Ref Range   Glucose-Capillary 176 (H) 70 - 99 mg/dL  Basic metabolic panel     Status: Abnormal   Collection Time: 11/09/17  4:24 AM  Result Value Ref Range   Sodium 138 135 - 145 mmol/L   Potassium 3.8 3.5 - 5.1 mmol/L   Chloride 104 98 - 111 mmol/L   CO2 27 22 - 32 mmol/L   Glucose, Bld 258 (H) 70 - 99 mg/dL   BUN 18 6 - 20 mg/dL   Creatinine, Ser 0.83 0.61 - 1.24 mg/dL   Calcium 8.5 (L) 8.9 - 10.3 mg/dL   GFR calc non Af Amer >60 >60 mL/min   GFR calc Af Amer >60 >60 mL/min    Comment: (NOTE) The eGFR has been calculated using the CKD EPI equation. This calculation has not been validated in all clinical situations. eGFR's persistently <60 mL/min signify possible Chronic Kidney Disease.    Anion gap 7 5 - 15    Comment: Performed at [facility], [address] [place], [place] [address]  Glucose, capillary     Status: Abnormal   Collection Time: 11/09/17  9:54 AM  Result Value Ref Range   Glucose-Capillary 241 (H) 70 - 99 mg/dL   Comment 1 Notify RN    Comment 2 Document in Chart   Glucose, capillary     Status: None   Collection Time: 11/09/17 12:51 PM  Result Value Ref Range   Glucose-Capillary 98 70 - 99 mg/dL   Comment 1 Notify RN    Comment 2 Document in Chart   Glucose, capillary     Status: Abnormal   Collection Time: 11/09/17  4:55 PM  Result Value Ref Range   Glucose-Capillary 191 (H) 70 - 99 mg/dL    Current Facility-Administered Medications  Medication Dose Route  Frequency Provider Last Rate Last Dose  . 0.9 %  sodium chloride infusion   Intravenous Continuous Loletha Grayer, MD 75 mL/hr at 11/09/17 [id number]    . 0.9 %  sodium chloride infusion   Intravenous PRN Loletha Grayer, MD 10 mL/hr at 11/09/17 0325 500 mL at 11/09/17 0325  . acetaminophen (TYLENOL) tablet 650 mg  650 mg Oral Q6H PRN Loletha Grayer, MD       Or  . acetaminophen (TYLENOL) suppository 650 mg  650 mg Rectal Q6H PRN Wieting, Richard, MD      . azithromycin (ZITHROMAX) 500 mg in sodium chloride 0.9 % 250 mL IVPB  500 mg Intravenous Daily Loletha Grayer, MD 250 mL/hr at 11/09/17 0916 500 mg at 11/09/17 0916  . bisacodyl (DULCOLAX) EC tablet 5 mg  5 mg Oral Daily PRN Loletha Grayer, MD      . enoxaparin (LOVENOX) injection 40 mg  40 mg Subcutaneous Q24H Loletha Grayer, MD   40 mg at 11/08/17 2217  . escitalopram (LEXAPRO) tablet 20 mg  20 mg Oral Daily Loletha Grayer, MD   20 mg at 11/09/17 0916  . haloperidol lactate (HALDOL) injection 1 mg  1 mg Intravenous Q6H PRN Wieting, Richard, MD      . insulin aspart (novoLOG) injection 0-15 Units  0-15 Units Subcutaneous TID WC Loletha Grayer, MD   3 Units at 11/09/17 1658  . insulin aspart (novoLOG) injection  0-5 Units  0-5 Units Subcutaneous QHS Wieting, Richard, MD      . ondansetron [facility]) tablet 4 mg  4 mg Oral Q6H PRN Loletha Grayer, MD       Or  . ondansetron (ZOFRAN) injection 4 mg  4 mg Intravenous Q6H PRN Wieting, Richard, MD      . piperacillin-tazobactam (ZOSYN) IVPB 3.375 g  3.375 g Intravenous Q8H Wieting, Richard, MD 12.5 mL/hr at 11/09/17 1114 3.375 g at 11/09/17 1114  . rOPINIRole (REQUIP) tablet 0.5 mg  0.5 mg Oral QHS Wieting, Richard, MD      . senna-docusate (Senokot-S) tablet 1 tablet  1 tablet Oral QHS PRN Loletha Grayer, MD      . traZODone (DESYREL) tablet 50 mg  50 mg Oral QHS PRN Loletha Grayer, MD        Musculoskeletal: Strength & Muscle Tone: decreased Gait & Station: unable to stand Patient  leans: N/A  Psychiatric Specialty Exam: Physical Exam  Nursing note and vitals reviewed. Constitutional: He appears well-developed and well-nourished.  HENT:  Head: Normocephalic and atraumatic.  Eyes: Pupils are equal, round, and reactive to light. Conjunctivae are normal.  Neck: Normal range of motion.  Cardiovascular: Regular rhythm and normal heart sounds.  Respiratory: Effort normal. No respiratory distress.  GI: Soft.  Musculoskeletal: Normal range of motion.  Neurological: He is alert.  Skin: Skin is warm and dry.  Psychiatric: His affect is blunt. He is withdrawn. Cognition and memory are impaired. He is noncommunicative.    Review of Systems  Unable to perform ROS: Patient unresponsive    Blood pressure 130/84, pulse 68, temperature 98.7 F (37.1 C), temperature source Oral, resp. rate 19, height 5' 10" (1.778 m), weight 83.6 kg, SpO2 95 %.Body mass index is 26.43 kg/m.  General Appearance: Disheveled  Eye Contact:  None  Speech:  Garbled  Volume:  Decreased  Mood:  Negative  Affect:  Blunt  Thought Process:  Disorganized  Orientation:  Negative  Thought Content:  Negative  Suicidal Thoughts:  No  Homicidal Thoughts:  No  Memory:  Negative  Judgement:  Negative  Insight:  Negative  Psychomotor Activity:  Negative  Concentration:  Concentration: Negative  Recall:  Negative  Fund of Knowledge:  Negative  Language:  Negative  Akathisia:  Negative  Handed:  Right  AIMS (if indicated):     Assets:  Housing  ADL's:  Impaired  Cognition:  Impaired,  Severe  Sleep:        Treatment Plan Summary: Plan This still remains a puzzling case.  There is no physiologic cause for the delirium that has been identified.  Patient does not appear to have any specific psychiatric disorder.  Wife proposed that possibly this could have been a "breakdown" because of "stress".  I acknowledged that that is such a broad category that it is impossible to completely rule it out.   Nevertheless at this point we do not have any indication for any specific treatment.  I would continue to minimize medication as much as possible.  We will hope that this continues to resolve spontaneously.  Disposition: No evidence of imminent risk to self or others at present.   Patient does not meet criteria for psychiatric inpatient admission.  Alethia Berthold, MD 11/09/2017 5:26 PM

## 2017-11-09 NOTE — Plan of Care (Signed)
No neuro changes noted this shift. 

## 2017-11-09 NOTE — Plan of Care (Signed)
  Problem: Health Behavior/Discharge Planning: Goal: Ability to manage health-related needs will improve Outcome: Progressing Note:  Psychiatry and neurology physicians are involved in the care of this patient, and are attempting to find the root cause of patient's symptoms / behaviors. Will continue to monitor for neurological improvement(s). Jari FavreSteven M Vibra Long Term Acute Care Hospitalmhoff

## 2017-11-10 LAB — GLUCOSE, CAPILLARY
GLUCOSE-CAPILLARY: 192 mg/dL — AB (ref 70–99)
GLUCOSE-CAPILLARY: 201 mg/dL — AB (ref 70–99)
Glucose-Capillary: 204 mg/dL — ABNORMAL HIGH (ref 70–99)
Glucose-Capillary: 172 mg/dL — ABNORMAL HIGH (ref 70–99)

## 2017-11-10 MED ORDER — BUPRENORPHINE HCL-NALOXONE HCL 8-2 MG SL SUBL
1.0000 | SUBLINGUAL_TABLET | Freq: Every day | SUBLINGUAL | Status: DC
  Administered 2017-11-10 – 2017-11-11 (×2): 1 via SUBLINGUAL
  Filled 2017-11-10 (×2): qty 1

## 2017-11-10 MED ORDER — METFORMIN HCL 500 MG PO TABS
500.0000 mg | ORAL_TABLET | Freq: Two times a day (BID) | ORAL | Status: DC
  Administered 2017-11-10 – 2017-11-11 (×2): 500 mg via ORAL
  Filled 2017-11-10 (×2): qty 1

## 2017-11-10 MED ORDER — AZITHROMYCIN 250 MG PO TABS
500.0000 mg | ORAL_TABLET | Freq: Every day | ORAL | Status: DC
  Administered 2017-11-11: 500 mg via ORAL
  Filled 2017-11-10: qty 2

## 2017-11-10 MED ORDER — AMOXICILLIN-POT CLAVULANATE 875-125 MG PO TABS
1.0000 | ORAL_TABLET | Freq: Two times a day (BID) | ORAL | Status: DC
  Administered 2017-11-10 – 2017-11-11 (×3): 1 via ORAL
  Filled 2017-11-10 (×3): qty 1

## 2017-11-10 NOTE — Consult Note (Signed)
[facility] Face-to-Face Psychiatry Consult   Reason for Consult: Consult for this 57 year old man with altered mental status. Referring Physician: Posey Pronto Patient Identification: Dennis Pineda MRN:  [id number] Principal Diagnosis: Altered mental status Diagnosis:   Patient Active Problem List   Diagnosis Date Noted  . Altered mental status [R41.82] 11/08/2017  . At risk for abuse of opiates [Z91.89] 11/08/2017  . Chronic pain syndrome [G89.4] 11/08/2017  . Syncope [R55] 09/19/2017  . Decreased responsiveness [R41.89] 09/18/2017  . Diabetes mellitus without complication ([place]) [X79.3] 05/10/2015  . Hypertension [I10] 05/10/2015  . Depression [F32.9] 05/10/2015  . Fracture of multiple ribs [S22.49XA] 05/01/2015  . Angioedema [T78.3XXA] 09/14/2014    Total Time spent with patient: 20 minutes  Subjective:   Dennis Pineda is a 57 y.o. male patient admitted with "I am feeling better and I want to go home".  HPI: See previous note.  57 year old man who came to the hospital with altered mental status and delirium.  Workup failed to find any specific etiology.  Patient has taken several days to get better.  Today I found him in bed still looking sickly disheveled and sedated but at least able to engage in regular conversation and make eye contact.  Today the patient speculates to me that possibly his symptoms could have been due to taking "too much Benadryl".  I asked him how much she had taken and he said perhaps 4 of them.  Patient continues to deny any other substance abuse.  Mood stable.  Denies any suicidal thoughts no sign of current acute psychosis.  Past Psychiatric History: Past history of substance abuse and recurrent episodes of altered mental status  Risk to Self:   Risk to Others:   Prior Inpatient Therapy:   Prior Outpatient Therapy:    Past Medical History:  Past Medical History:  Diagnosis Date  . Arthritis    gout  . Depression   . Diabetes mellitus without complication  ([place])   . Hypertension     Past Surgical History:  Procedure Laterality Date  . APPENDECTOMY    . KNEE SURGERY    . ROTATOR CUFF REPAIR    . SHOULDER SURGERY     Family History:  Family History  Problem Relation Age of Onset  . Diabetes Mother   . Cancer Father        Liver  . Diabetes Father   . Diabetes Sister   . Diabetes Maternal Grandmother    Family Psychiatric  History: See previous note Social History:  Social History   Substance and Sexual Activity  Alcohol Use No  . Alcohol/week: 0.0 standard drinks     Social History   Substance and Sexual Activity  Drug Use No    Social History   Socioeconomic History  . Marital status: Married    Spouse name: Not on file  . Number of children: Not on file  . Years of education: Not on file  . Highest education level: Not on file  Occupational History  . Not on file  Social Needs  . Financial resource strain: Not hard at all  . Food insecurity:    Worry: Never true    Inability: Never true  . Transportation needs:    Medical: Yes    Non-medical: Yes  Tobacco Use  . Smoking status: Former Smoker    Types: Cigarettes  . Smokeless tobacco: Never Used  Substance and Sexual Activity  . Alcohol use: No    Alcohol/week: 0.0 standard drinks  .  Drug use: No  . Sexual activity: Yes  Lifestyle  . Physical activity:    Days per week: 4 days    Minutes per session: 40 min  . Stress: To some extent  Relationships  . Social connections:    Talks on phone: Twice a week    Gets together: Twice a week    Attends religious service: Never    Active member of club or organization: No    Attends meetings of clubs or organizations: Never    Relationship status: Married  Other Topics Concern  . Not on file  Social History Narrative   Lives with wife, works, drives.   Additional Social History:    Allergies:  No Known Allergies  Labs:  Results for orders placed or performed during the hospital encounter of  11/08/17 (from the past 48 hour(s))  Glucose, capillary     Status: Abnormal   Collection Time: 11/08/17  9:12 PM  Result Value Ref Range   Glucose-Capillary 176 (H) 70 - 99 mg/dL  Basic metabolic panel     Status: Abnormal   Collection Time: 11/09/17  4:24 AM  Result Value Ref Range   Sodium 138 135 - 145 mmol/L   Potassium 3.8 3.5 - 5.1 mmol/L   Chloride 104 98 - 111 mmol/L   CO2 27 22 - 32 mmol/L   Glucose, Bld 258 (H) 70 - 99 mg/dL   BUN 18 6 - 20 mg/dL   Creatinine, Ser 0.83 0.61 - 1.24 mg/dL   Calcium 8.5 (L) 8.9 - 10.3 mg/dL   GFR calc non Af Amer >60 >60 mL/min   GFR calc Af Amer >60 >60 mL/min    Comment: (NOTE) The eGFR has been calculated using the CKD EPI equation. This calculation has not been validated in all clinical situations. eGFR's persistently <60 mL/min signify possible Chronic Kidney Disease.    Anion gap 7 5 - 15    Comment: Performed at [facility], [address] [place], [place] [address]  Glucose, capillary     Status: Abnormal   Collection Time: 11/09/17  9:54 AM  Result Value Ref Range   Glucose-Capillary 241 (H) 70 - 99 mg/dL   Comment 1 Notify RN    Comment 2 Document in Chart   Glucose, capillary     Status: None   Collection Time: 11/09/17 12:51 PM  Result Value Ref Range   Glucose-Capillary 98 70 - 99 mg/dL   Comment 1 Notify RN    Comment 2 Document in Chart   Glucose, capillary     Status: Abnormal   Collection Time: 11/09/17  4:55 PM  Result Value Ref Range   Glucose-Capillary 191 (H) 70 - 99 mg/dL  Glucose, capillary     Status: Abnormal   Collection Time: 11/09/17  9:11 PM  Result Value Ref Range   Glucose-Capillary 181 (H) 70 - 99 mg/dL   Comment 1 Notify RN    Comment 2 Document in Chart   Glucose, capillary     Status: Abnormal   Collection Time: 11/10/17  7:25 AM  Result Value Ref Range   Glucose-Capillary 204 (H) 70 - 99 mg/dL  Glucose, capillary     Status: Abnormal   Collection Time: 11/10/17 11:59 AM   Result Value Ref Range   Glucose-Capillary 192 (H) 70 - 99 mg/dL  Glucose, capillary     Status: Abnormal   Collection Time: 11/10/17  4:45 PM  Result Value Ref Range   Glucose-Capillary  201 (H) 70 - 99 mg/dL    Current Facility-Administered Medications  Medication Dose Route Frequency Provider Last Rate Last Dose  . 0.9 %  sodium chloride infusion   Intravenous PRN Loletha Grayer, MD 10 mL/hr at 11/09/17 0325 500 mL at 11/09/17 0325  . acetaminophen (TYLENOL) tablet 650 mg  650 mg Oral Q6H PRN Loletha Grayer, MD       Or  . acetaminophen (TYLENOL) suppository 650 mg  650 mg Rectal Q6H PRN Loletha Grayer, MD      . amoxicillin-clavulanate (AUGMENTIN) 875-125 MG per tablet 1 tablet  1 tablet Oral Q12H Loletha Grayer, MD   1 tablet at 11/10/17 1101  . [START ON 11/11/2017] azithromycin (ZITHROMAX) tablet 500 mg  500 mg Oral Daily Wieting, Richard, MD      . bisacodyl (DULCOLAX) EC tablet 5 mg  5 mg Oral Daily PRN Loletha Grayer, MD      . buprenorphine-naloxone (SUBOXONE) 8-2 mg per SL tablet 1 tablet  1 tablet Sublingual Daily Loletha Grayer, MD   1 tablet at 11/10/17 1117  . enoxaparin (LOVENOX) injection 40 mg  40 mg Subcutaneous Q24H Loletha Grayer, MD   40 mg at 11/09/17 2141  . escitalopram (LEXAPRO) tablet 20 mg  20 mg Oral Daily Wieting, Richard, MD   20 mg at 11/10/17 1056  . haloperidol lactate (HALDOL) injection 1 mg  1 mg Intravenous Q6H PRN Wieting, Richard, MD      . insulin aspart (novoLOG) injection 0-15 Units  0-15 Units Subcutaneous TID WC Loletha Grayer, MD   5 Units at 11/10/17 1647  . insulin aspart (novoLOG) injection 0-5 Units  0-5 Units Subcutaneous QHS Wieting, Richard, MD      . metFORMIN (GLUCOPHAGE) tablet 500 mg  500 mg Oral BID WC Loletha Grayer, MD   500 mg at 11/10/17 1619  . ondansetron (ZOFRAN) tablet 4 mg  4 mg Oral Q6H PRN Loletha Grayer, MD       Or  . ondansetron [employer]) injection 4 mg  4 mg Intravenous Q6H PRN Wieting, Richard, MD       . rOPINIRole (REQUIP) tablet 0.5 mg  0.5 mg Oral QHS Loletha Grayer, MD   0.5 mg at 11/09/17 2141  . senna-docusate (Senokot-S) tablet 1 tablet  1 tablet Oral QHS PRN Wieting, Richard, MD      . traZODone (DESYREL) tablet 50 mg  50 mg Oral QHS PRN Loletha Grayer, MD        Musculoskeletal: Strength & Muscle Tone: within normal limits Gait & Station: unsteady Patient leans: N/A  Psychiatric Specialty Exam: Physical Exam  Nursing note and vitals reviewed. Constitutional: He appears well-developed and well-nourished.  HENT:  Head: Normocephalic and atraumatic.  Eyes: Pupils are equal, round, and reactive to light. Conjunctivae are normal.  Neck: Normal range of motion.  Cardiovascular: Regular rhythm and normal heart sounds.  Respiratory: Effort normal. No respiratory distress.  GI: Soft.  Musculoskeletal: Normal range of motion.  Neurological: He is alert.  Skin: Skin is warm and dry.  Psychiatric: Judgment normal. His affect is blunt. His speech is delayed. He is slowed. Thought content is not paranoid. He expresses no homicidal and no suicidal ideation. He exhibits abnormal recent memory.    Review of Systems  Constitutional: Negative.   HENT: Negative.   Eyes: Negative.   Respiratory: Negative.   Cardiovascular: Negative.   Gastrointestinal: Negative.   Musculoskeletal: Negative.   Skin: Negative.   Neurological: Negative.   Psychiatric/Behavioral: Positive for memory  loss. Negative for depression, hallucinations, substance abuse and suicidal ideas. The patient is not nervous/anxious and does not have insomnia.     Blood pressure (!) 144/83, pulse 65, temperature 98.8 F (37.1 C), temperature source Oral, resp. rate 16, height _0  (1.778 m), weight 82.9 kg, SpO2 93 %.Body mass index is 26.23 kg/m.  General Appearance: Disheveled  Eye Contact:  Minimal  Speech:  Slow  Volume:  Decreased  Mood:  Dysphoric  Affect:  Congruent  Thought Process:  Goal Directed   Orientation:  Full (Time, Place, and Person)  Thought Content:  Tangential  Suicidal Thoughts:  No  Homicidal Thoughts:  No  Memory:  Immediate;   Fair Recent;   Poor Remote;   Fair  Judgement:  Impaired  Insight:  Shallow  Psychomotor Activity:  Decreased  Concentration:  Concentration: Fair  Recall:  [employer] of Knowledge:  Fair  Language:  Fair  Akathisia:  No  Handed:  Right  AIMS (if indicated):     Assets:  Desire for Improvement Housing Social Support  ADL's:  Impaired  Cognition:  Impaired,  Mild  Sleep:        Treatment Plan Summary: Plan Follow-up for patient who continues to very slowly get better.  His speculation that his delirium could have been due to Benadryl is certainly not impossible although 100 mg of Benadryl is not very much to have caused a delirium that lasted several days.  It is possible that he may be taking much larger doses of anticholinergic medicine either intentionally to abuse it or out of confusion when he feels like it is going to help with his skin rash.  I educated him about the dangers of anticholinergic toxicity.  In any case at this point no need for commitment still no need for hospitalization and no further psychiatric treatment.  Patient can be discharged home and is strongly encouraged to stay only on medicines that are specifically prescribed for him and certainly not to exceed any recommended doses.  Disposition: No evidence of imminent risk to self or others at present.   Patient does not meet criteria for psychiatric inpatient admission. Supportive therapy provided about ongoing stressors. Discussed crisis plan, support from social network, calling 911, coming to the Emergency Department, and calling Suicide Hotline.  Alethia Berthold, MD 11/10/2017 5:31 PM

## 2017-11-10 NOTE — Progress Notes (Signed)
Patient ID: Dennis Pineda, male   DOB: 08-30-1960, 57 y.o.   MRN: [id number]  [employer] PROGRESS NOTE  Dennis Pineda Dennis Pineda:[id number] DOB: 1960-12-15 DOA: 11/08/2017 PCP: Patient, No Pcp Per  HPI/Subjective: Patient better today than yesterday.  Able to answer some more questions.  1 out of 3 and word recall.  Unable to do math.  Able to spell the word world backwards.  Follow commands and moves all extremities.  Still had periods where he had confusion where he was talking about things blowing up in a mashed potato field.  Patient asking for Suboxone.  Objective: Vitals:   11/10/17 0342 11/10/17 0723  BP: 124/87 (!) 144/83  Pulse: 64 65  Resp: 16   Temp: 98.9 F (37.2 C) 98.8 F (37.1 C)  SpO2: 93% 93%    Filed Weights   11/08/17 0723 11/09/17 1427 11/10/17 0342  Weight: 83.4 kg 83.6 kg 82.9 kg    ROS: Review of Systems  Constitutional: Negative for fever.  Respiratory: Negative for shortness of breath.   Cardiovascular: Negative for chest pain.  Gastrointestinal: Negative for abdominal pain.  Genitourinary: Negative for dysuria.  Neurological: Negative for headaches.   Exam: Physical Exam  HENT:  Nose: No mucosal edema.  Mouth/Throat: No oropharyngeal exudate or posterior oropharyngeal edema.  Eyes: Pupils are equal, round, and reactive to light. Conjunctivae and lids are normal.  Neck: No JVD present. Carotid bruit is not present. No edema present. No thyroid mass and no thyromegaly present.  Cardiovascular: S1 normal and S2 normal. Exam reveals no gallop.  No murmur heard. Pulses:      Dorsalis pedis pulses are 2+ on the right side, and 2+ on the left side.  Respiratory: No respiratory distress. He has no wheezes. He has no rhonchi. He has no rales.  GI: Soft. Bowel sounds are normal. There is no tenderness.  Musculoskeletal:       Right ankle: He exhibits no swelling.       Left ankle: He exhibits no swelling.  Lymphadenopathy:    He has no cervical  adenopathy.  Neurological: He is alert.  Did follow all commands.  Was able to move all of his extremities.  Skin: Skin is warm. No rash noted. Nails show no clubbing.  Psychiatric:  Patient has periods of clarity and answering questions but some other periods of confusion.      Data Reviewed: Basic Metabolic Panel: Recent Labs  Lab 11/08/17 0317 11/09/17 0424  NA 132* 138  K 4.5 3.8  CL 96* 104  CO2 24 27  GLUCOSE 351* 258*  BUN 25* 18  CREATININE 0.94 0.83  CALCIUM 9.7 8.5*   Liver Function Tests: Recent Labs  Lab 11/08/17 0317  AST 24  ALT 19  ALKPHOS 93  BILITOT 0.6  PROT 7.7  ALBUMIN 4.3    Recent Labs  Lab 11/08/17 0317  AMMONIA 24   CBC: Recent Labs  Lab 11/08/17 0317  WBC 6.1  HGB 14.0  HCT 40.2  MCV 87.3  PLT 279   CBG: Recent Labs  Lab 11/09/17 1251 11/09/17 1655 11/09/17 2111 11/10/17 0725 11/10/17 1159  GLUCAP 98 191* 181* 204* 192*    Scheduled Meds: . amoxicillin-clavulanate  1 tablet Oral Q12H  . [START ON 11/11/2017] azithromycin  500 mg Oral Daily  . buprenorphine-naloxone  1 tablet Sublingual Daily  . enoxaparin (LOVENOX) injection  40 mg Subcutaneous Q24H  . escitalopram  20 mg Oral Daily  . insulin aspart  0-15 Units  Subcutaneous TID WC  . insulin aspart  0-5 Units Subcutaneous QHS  . metFORMIN  500 mg Oral BID WC  . rOPINIRole  0.5 mg Oral QHS   Continuous Infusions: . sodium chloride 500 mL (11/09/17 0325)    Assessment/Plan:  1. Acute delirium.  PRN Haldol.  Try to get sleeping with trazodone at night.  Wondering if there could be an element of restless leg syndrome trial of low-dose Requip at night. 2. History of abuse. On Suboxone as outpatient.  I told the patient I will not prescribe 3 times a day that he is taking as outpatient.  [facility] are already closed for the day.  I prescribed Suboxone once a day only.  I advised him that he has to taper this as outpatient  over months.  I also advised he must stop taking Benadryl. 3. Depression on Lexapro.  Wife states patient also takes Zoloft but I will hold back as its in the same class as the Lexapro 4. Type 2 diabetes mellitus with a hemoglobin A1c of 12.3.  His sugars here do not go along with that.  But as per wife he does eat a lot of sweets as outpatient.  I am scared to put him on insulin at this point.  Start metformin now. 5. Possible pneumonia seen on x-ray.  Switch to p.o. Augmentin and Zithromax p.o. 6. Patient did well with physical therapy  Code Status:     Code Status Orders  (From admission, onward)         Start     Ordered   11/08/17 0721  Full code  Continuous     11/08/17 0720        Code Status History    Date Active Date Inactive Code Status Order ID Comments User Context   09/19/2017 0235 09/20/2017 1517 Full Code [id number]  Oralia ManisWillis, David, MD Inpatient   09/14/2014 1041 09/15/2014 1726 Full Code [id number]  Houston SirenSainani, Vivek J, MD Inpatient     Family Communication: Wife at the bedside this morning Disposition Plan: Hopefully home soon with outpatient neurology follow-up  Consultants:  Neurology  Psychiatry  Antibiotics:  Zithromax  Augmentin  Time spent: 28 minutes  Taiana Temkin Standard PacificWieting  [employer]

## 2017-11-10 NOTE — Plan of Care (Signed)
  Problem: Clinical Measurements: Goal: Ability to maintain clinical measurements within normal limits will improve Outcome: Not Progressing Note:  Hemoglobin A1C = 12.3%. Will continue to monitor B.S. Jari FavreSteven M [facility] Cent[facility]hoff

## 2017-11-10 NOTE — Care Management Note (Addendum)
Case Management Note  Patient Details  Name: Tomi BambergerManuel Cali MRN: [id number] Date of Birth: 03/23/1960  Subjective/Objective:          Spoke with Dr. Renae GlossWieting, patient is more alert today.  Able to speak/communicate but still confused.  Plan to stay one more day with possible d/c tomorrow.     Action/Plan:   Expected Discharge Date:                  Expected Discharge Plan:  Home/Self Care  In-House Referral:     Discharge planning Services     Post Acute Care Choice:    Choice offered to:     DME Arranged:    DME Agency:     HH Arranged:    HH Agency:     Status of Service:  In process, will continue to follow  If discussed at Long Length of Stay Meetings, dates discussed:    Additional Comments:  Sherren KernsJennifer L Latrelle Bazar, RN 11/10/2017, 9:57 AM

## 2017-11-11 LAB — GLUCOSE, CAPILLARY: GLUCOSE-CAPILLARY: 198 mg/dL — AB (ref 70–99)

## 2017-11-11 MED ORDER — AMOXICILLIN-POT CLAVULANATE 875-125 MG PO TABS: 1.0000 | ORAL_TABLET | Freq: Two times a day (BID) | ORAL | 0 refills | Status: AC

## 2017-11-11 NOTE — Progress Notes (Signed)
Advanced care plan.  Purpose of the Encounter: CODE STATUS  Parties in Attendance:Patient and family  Patient's Decision Capacity: Good  Subjective/Patient's story: Presented initially to emergency room for confusion   Objective/Medical story Patient was admitted for evaluation of encephalopathy Needs neurology evaluation   Goals of care determination:  Advance care directives and goals of care discussed Patient and family want everything done which includes cpr , intubation and ventilator if need arises   CODE STATUS: Full code   Time spent discussing advanced care planning: 16 minutes

## 2017-11-11 NOTE — Progress Notes (Signed)
Tomi BambergerManuel Hackworth to be D/C'd Home per MD order. Patient given discharge teaching and paperwork regarding medications, diet, follow-up appointments and activity. Patient understanding verbalized. No questions or complaints at this time. Skin condition as charted. IV and telemetry removed prior to leaving.  No further needs by Care Management/Social Work. Prescription given to patient.  An After Visit Summary was printed and given to the patient.   Patient escorted via wheelchair by volunteer.  Clayborne DanaBeck, Annora Guderian B

## 2017-11-11 NOTE — Discharge Summary (Signed)
[facility] - [place] at [facility]   PATIENT NAME: Dennis Pineda    MR#:  [id number]  DATE OF BIRTH:  [birth year]  DATE OF ADMISSION:  11/08/2017 ADMITTING PHYSICIAN: Barbaraann Rondo, MD  DATE OF DISCHARGE: 11/11/2017 11:51 AM  PRIMARY CARE PHYSICIAN: Patient, No Pcp Per   ADMISSION DIAGNOSIS:  Hallucinations [R44.3] Altered mental status, unspecified altered mental status type [R41.82] Type 2 diabetes mellitus Hypertension Arthritis Gout DISCHARGE DIAGNOSIS:  Principal Problem:   Altered mental status Active Problems:   At risk for abuse of opiates   Chronic pain syndrome Arthritis Gout Altered mental status secondary to polypharmacy  SECONDARY DIAGNOSIS:   Past Medical History:  Diagnosis Date  . Arthritis    gout  . Depression   . Diabetes mellitus without complication (HCC)   . Hypertension      ADMITTING HISTORY Dennis Pineda  is a 57 y.o. male with a known history of depression, T2NIDDM, HTN, gout p/w AMS/confusion, lethargy, visual hallucinations. Pt is AAOx0, and does not provide verbal response to my questions. He is lethargic and ill-appearing, but is easily arousable. He does not appear toxic. When I call his name or tap his shoulder, he opens his eyes briefly and then closes them again. He is restless/agitated, fidgeting and moving about in bed. He occasionally exhibits jerking movements, but these movements do not appear as myoclonus. He cannot provide any Hx/ROS. Per ED provider/staff and documentation, pt p/w 2d Hx confusion and agitation, acutely worse since last evening (Tuesday 11/07/2017). There was report of visual hallucinations per wife, w/ pt reportedly stating the presence of an intruder in the house that was not actually there. He was reportedly also grabbing/reaching towards things that are not there. There have been no medication changes. He had an admission under similar circumstances In late 08/2017.  HOSPITAL COURSE:   Patient admitted to medical floor.  Seen by neurology and had a evaluation with EEG which did not show any abnormality.  Patient was evaluated by psychiatry for hallucinations.  No danger to self or others did not need any inpatient psychiatry admission.  Patient mental status improved Benadryl was held.  He takes Suboxone as an outpatient prescribed by [facility].  Patient RPR was nonreactive and HIV test was nonreactive.  Urinalysis did not show any infection.  Patient mental status back to baseline.  Advised her to decrease the dose of Suboxone and consulted [facility] for further reduction in the dose.  Advised to stop taking Benadryl to avoid drowsiness and confusion.  CONSULTS OBTAINED:  Treatment Team:  Barbaraann Rondo, MD Thana Farr, MD Clapacs, Jackquline Denmark, MD  DRUG ALLERGIES:  No Known Allergies  DISCHARGE MEDICATIONS:   Allergies as of 11/11/2017   No Known Allergies     Medication List    STOP taking these medications   allopurinol 100 MG tablet Commonly known as:  ZYLOPRIM   meloxicam 15 MG tablet Commonly known as:  MOBIC   naproxen 500 MG tablet Commonly known as:  NAPROSYN   predniSONE 10 MG tablet Commonly known as:  DELTASONE   sertraline 100 MG tablet Commonly known as:  ZOLOFT     TAKE these medications   amoxicillin-clavulanate 875-125 MG tablet Commonly known as:  AUGMENTIN Take 1 tablet by mouth every 12 (twelve) hours for 5 days.   buprenorphine-naloxone 8-2 mg Subl SL tablet Commonly known as:  SUBOXONE Place 1 tablet under the tongue daily.   CLEAR EYES OP Place 2 drops  into both eyes daily as needed (for red eyes).   cyclobenzaprine 10 MG tablet Commonly known as:  FLEXERIL Take 10 mg by mouth 3 (three) times daily as needed for muscle spasms.   escitalopram 20 MG tablet Commonly known as:  LEXAPRO Take 20 mg by mouth daily.   methocarbamol 500 MG tablet Commonly known as:  ROBAXIN Take 1  tablet (500 mg total) by mouth at bedtime.       Today  Patient seen and evaluated today Mental status back to baseline Tolerating diet well  VITAL SIGNS:  Blood pressure (!) 135/92, pulse 77, temperature 98.2 F (36.8 C), temperature source Oral, resp. rate 16, height 5\' 10"  (1.778 m), weight 81.5 kg, SpO2 96 %.  I/O:    Intake/Output Summary (Last 24 hours) at 11/11/2017 1423 Last data filed at 11/11/2017 0000 Gross per 24 hour  Intake 240 ml  Output 700 ml  Net -460 ml    PHYSICAL EXAMINATION:  Physical Exam  GENERAL:  57 y.o.-year-old patient lying in the bed with no acute distress.  LUNGS: Normal breath sounds bilaterally, no wheezing, rales,rhonchi or crepitation. No use of accessory muscles of respiration.  CARDIOVASCULAR: S1, S2 normal. No murmurs, rubs, or gallops.  ABDOMEN: Soft, non-tender, non-distended. Bowel sounds present. No organomegaly or mass.  NEUROLOGIC: Moves all 4 extremities. PSYCHIATRIC: The patient is alert and oriented x 3.  SKIN: No obvious rash, lesion, or ulcer.   DATA REVIEW:   CBC Recent Labs  Lab 11/08/17 0317  WBC 6.1  HGB 14.0  HCT 40.2  PLT 279    Chemistries  Recent Labs  Lab 11/08/17 0317 11/09/17 0424  NA 132* 138  K 4.5 3.8  CL 96* 104  CO2 24 27  GLUCOSE 351* 258*  BUN 25* 18  CREATININE 0.94 0.83  CALCIUM 9.7 8.5*  AST 24  --   ALT 19  --   ALKPHOS 93  --   BILITOT 0.6  --     Cardiac Enzymes No results for input(s): TROPONINI in the last 168 hours.  Microbiology Results  Results for orders placed or performed during the hospital encounter of 09/14/14  MRSA PCR Screening     Status: None   Collection Time: 09/14/14 10:54 AM  Result Value Ref Range Status   MRSA by PCR NEGATIVE NEGATIVE Final    Comment:        The GeneXpert MRSA Assay (FDA approved for NASAL specimens only), is one component of a comprehensive MRSA colonization surveillance program. It is not intended to diagnose MRSA infection  nor to guide or monitor treatment for MRSA infections.     RADIOLOGY:  No results found.  Follow up with PCP in 1 week.  Management plans discussed with the patient, family and they are in agreement.  CODE STATUS: Full code    Code Status Orders  (From admission, onward)         Start     Ordered   11/08/17 0721  Full code  Continuous     11/08/17 0720        Code Status History    Date Active Date Inactive Code Status Order ID Comments User Context   09/19/2017 0235 09/20/2017 1517 Full Code [id number]  Oralia Manis, MD Inpatient   09/14/2014 1041 09/15/2014 1726 Full Code [id number]  Houston Siren, MD Inpatient      TOTAL TIME TAKING CARE OF THIS PATIENT ON DAY OF DISCHARGE: more than 34 minutes.  Ihor AustinPavan Pyreddy M.D on 11/11/2017 at 2:23 PM  Between 7am to 6pm - Pager - [phone]  After 6pm go to [email or web address] - password [email or web address]  [facility] [place] [facility]  Office  [phone]  CC: Primary care physician; Patient, No Pcp Per  Note: This dictation was prepared with Dragon dictation along with smaller phrase technology. Any transcriptional errors that result from this process are unintentional.

## 2018-03-07 ENCOUNTER — Encounter: Payer: Self-pay | Admitting: Emergency Medicine

## 2018-03-07 ENCOUNTER — Emergency Department
Admission: EM | Admit: 2018-03-07 | Discharge: 2018-03-07 | Disposition: A | Discharge status: Home / Self Care | Payer: Self-pay | Attending: Emergency Medicine | Admitting: Emergency Medicine

## 2018-03-07 ENCOUNTER — Emergency Department: Payer: Self-pay

## 2018-03-07 DIAGNOSIS — E119 Type 2 diabetes mellitus without complications: Secondary | ICD-10-CM | POA: Insufficient documentation

## 2018-03-07 DIAGNOSIS — J111 Influenza due to unidentified influenza virus with other respiratory manifestations: Secondary | ICD-10-CM | POA: Insufficient documentation

## 2018-03-07 DIAGNOSIS — R69 Illness, unspecified: Secondary | ICD-10-CM

## 2018-03-07 DIAGNOSIS — Z87891 Personal history of nicotine dependence: Secondary | ICD-10-CM | POA: Insufficient documentation

## 2018-03-07 DIAGNOSIS — I10 Essential (primary) hypertension: Secondary | ICD-10-CM | POA: Insufficient documentation

## 2018-03-07 DIAGNOSIS — Z79899 Other long term (current) drug therapy: Secondary | ICD-10-CM | POA: Insufficient documentation

## 2018-03-07 LAB — CBC WITH DIFFERENTIAL/PLATELET
ABS IMMATURE GRANULOCYTES: 0.02 10*3/uL (ref 0.00–0.07)
BASOS PCT: 1 %
Basophils Absolute: 0.1 10*3/uL (ref 0.0–0.1)
EOS ABS: 0.2 10*3/uL (ref 0.0–0.5)
Eosinophils Relative: 4 %
HCT: 42.9 % (ref 39.0–52.0)
Hemoglobin: 14.4 g/dL (ref 13.0–17.0)
Immature Granulocytes: 0 %
Lymphocytes Relative: 27 %
Lymphs Abs: 1.6 10*3/uL (ref 0.7–4.0)
MCH: 29.4 pg (ref 26.0–34.0)
MCHC: 33.6 g/dL (ref 30.0–36.0)
MCV: 87.7 fL (ref 80.0–100.0)
MONO ABS: 0.4 10*3/uL (ref 0.1–1.0)
Monocytes Relative: 7 %
NEUTROS ABS: 3.7 10*3/uL (ref 1.7–7.7)
NEUTROS PCT: 61 %
PLATELETS: 275 10*3/uL (ref 150–400)
RBC: 4.89 MIL/uL (ref 4.22–5.81)
RDW: 12 % (ref 11.5–15.5)
WBC: 6 10*3/uL (ref 4.0–10.5)
nRBC: 0 % (ref 0.0–0.2)

## 2018-03-07 LAB — COMPREHENSIVE METABOLIC PANEL
ALT: 15 U/L (ref 0–44)
ANION GAP: 8 (ref 5–15)
AST: 16 U/L (ref 15–41)
Albumin: 3.8 g/dL (ref 3.5–5.0)
Alkaline Phosphatase: 100 U/L (ref 38–126)
BUN: 20 mg/dL (ref 6–20)
CALCIUM: 8.8 mg/dL — AB (ref 8.9–10.3)
CHLORIDE: 102 mmol/L (ref 98–111)
CO2: 25 mmol/L (ref 22–32)
Creatinine, Ser: 0.89 mg/dL (ref 0.61–1.24)
Glucose, Bld: 206 mg/dL — ABNORMAL HIGH (ref 70–99)
Potassium: 3.7 mmol/L (ref 3.5–5.1)
SODIUM: 135 mmol/L (ref 135–145)
Total Bilirubin: 0.5 mg/dL (ref 0.3–1.2)
Total Protein: 7.1 g/dL (ref 6.5–8.1)

## 2018-03-07 LAB — TROPONIN I

## 2018-03-07 LAB — BRAIN NATRIURETIC PEPTIDE: B NATRIURETIC PEPTIDE 5: 41 pg/mL (ref 0.0–100.0)

## 2018-03-07 MED ORDER — ESCITALOPRAM OXALATE 20 MG PO TABS: 20.0000 mg | ORAL_TABLET | Freq: Every day | ORAL | 0 refills | Status: AC

## 2018-03-07 MED ORDER — ONDANSETRON 4 MG PO TBDP: 4.0000 mg | ORAL_TABLET | Freq: Three times a day (TID) | ORAL | 0 refills | Status: DC | PRN

## 2018-03-07 MED ORDER — ALLOPURINOL 100 MG PO TABS: 100.0000 mg | ORAL_TABLET | Freq: Every day | ORAL | 2 refills | Status: AC

## 2018-03-07 MED ORDER — PREDNISONE 20 MG PO TABS: 40.0000 mg | ORAL_TABLET | Freq: Every day | ORAL | 0 refills | Status: DC

## 2018-03-07 NOTE — ED Notes (Signed)
Says over the weekend, congested, wheezing and chestg pain.  Lungs with crackles on left side and 1/2 way up on right.  Not much cough now.  Says the sore throat is worse at night.

## 2018-03-07 NOTE — Discharge Instructions (Signed)
Continue monitoring your blood sugar closely while taking prednisone.  Discontinue prednisone if your blood sugar becomes elevated above 200.  Follow-up with a primary care doctor as soon as possible for continued monitoring of your symptoms and management of your chronic medical issues.

## 2018-03-07 NOTE — ED Provider Notes (Signed)
[facility] Emergency Department Provider Note  ____________________________________________  Time seen: Approximately 2:17 PM  I have reviewed the triage vital signs and the nursing notes.   HISTORY  Chief Complaint Sore Throat and Fever    HPI Dennis Pineda is a 58 y.o. male with a history of gout, depression, diabetes, hypertension who complains of a week of symptoms of nasal congestion, intermittent sharp chest pain, wheezing and shortness of breath, body aches, fatigue.  He reports that a few days ago he also had a fever which is now resolved.  He also has a sore throat.  No vomiting or diarrhea.  Tolerating oral intake.  Taking his medications, but ran out of his Lexapro and allopurinol.  Does not currently have a primary care doctor.      Past Medical History:  Diagnosis Date  . Arthritis    gout  . Depression   . Diabetes mellitus without complication (HCC)   . Hypertension      Patient Active Problem List   Diagnosis Date Noted  . Altered mental status 11/08/2017  . At risk for abuse of opiates 11/08/2017  . Chronic pain syndrome 11/08/2017  . Syncope 09/19/2017  . Decreased responsiveness 09/18/2017  . Diabetes mellitus without complication (HCC) 05/10/2015  . Hypertension 05/10/2015  . Depression 05/10/2015  . Fracture of multiple ribs 05/01/2015  . Angioedema 09/14/2014     Past Surgical History:  Procedure Laterality Date  . APPENDECTOMY    . KNEE SURGERY    . ROTATOR CUFF REPAIR    . SHOULDER SURGERY       Prior to Admission medications   Medication Sig Start Date End Date Taking? Authorizing Provider  allopurinol (ZYLOPRIM) 100 MG tablet Take 1 tablet (100 mg total) by mouth daily. 03/07/18 06/05/18  Sharman Cheek, MD  buprenorphine-naloxone (SUBOXONE) 8-2 mg SUBL SL tablet Place 1 tablet under the tongue daily.    [provider]  cyclobenzaprine (FLEXERIL) 10 MG tablet Take 10 mg by mouth 3 (three) times  daily as needed for muscle spasms.    [provider]  escitalopram (LEXAPRO) 20 MG tablet Take 1 tablet (20 mg total) by mouth daily for 30 days. 03/07/18 04/06/18  Sharman Cheek, MD  methocarbamol (ROBAXIN) 500 MG tablet Take 1 tablet (500 mg total) by mouth at bedtime. 09/26/17   Cuthriell, Delorise Royals, PA-C  Naphazoline HCl (CLEAR EYES OP) Place 2 drops into both eyes daily as needed (for red eyes).    [provider]  ondansetron (ZOFRAN ODT) 4 MG disintegrating tablet Take 1 tablet (4 mg total) by mouth every 8 (eight) hours as needed for nausea or vomiting. 03/07/18   Sharman Cheek, MD  predniSONE (DELTASONE) 20 MG tablet Take 2 tablets (40 mg total) by mouth daily. 03/07/18   Sharman Cheek, MD     Allergies Patient has no known allergies.   Family History  Problem Relation Age of Onset  . Diabetes Mother   . Cancer Father        Liver  . Diabetes Father   . Diabetes Sister   . Diabetes Maternal Grandmother     Social History Social History   Tobacco Use  . Smoking status: Former Smoker    Types: Cigarettes  . Smokeless tobacco: Never Used  Substance Use Topics  . Alcohol use: No    Alcohol/week: 0.0 standard drinks  . Drug use: No    Review of Systems  Constitutional:   Positive fever and chills  chills.  ENT:   Positive sore throat.  Positive nasal congestion Cardiovascular:   Positive intermittent not exertional, nonpleuritic chest pain without syncope. Respiratory:   Positive shortness of breath and nonproductive cough. Gastrointestinal:   Negative for abdominal pain, vomiting and diarrhea.  Musculoskeletal:   Negative for focal pain or swelling All other systems reviewed and are negative except as documented above in ROS and HPI.  ____________________________________________   PHYSICAL EXAM:  VITAL SIGNS: ED Triage Vitals  Enc Vitals Group     BP 03/07/18 1137 140/90     Pulse Rate 03/07/18 1137 62     Resp 03/07/18 1137 17      Temp 03/07/18 1137 (!) 97.5 F (36.4 C)     Temp Source 03/07/18 1137 Oral     SpO2 03/07/18 1137 99 %     Weight 03/07/18 1138 175 lb (79.4 kg)     Height 03/07/18 1138 5\' 11"  (1.803 m)     Head Circumference --      Peak Flow --      Pain Score 03/07/18 1137 6     Pain Loc --      Pain Edu? --      Excl. in GC? --     Vital signs reviewed, nursing assessments reviewed.   Constitutional:   Alert and oriented. Non-toxic appearance. Eyes:   Conjunctivae are normal. EOMI. PERRL. ENT      Head:   Normocephalic and atraumatic.      Nose:   No congestion/rhinnorhea.       Mouth/Throat:   MMM, positive pharyngeal erythema. No peritonsillar mass.       Neck:   No meningismus. Full ROM. Hematological/Lymphatic/Immunilogical:   No cervical lymphadenopathy. Cardiovascular:   RRR. Symmetric bilateral radial and DP pulses.  No murmurs. Cap refill less than 2 seconds. Respiratory:   Normal respiratory effort without tachypnea/retractions. Breath sounds are clear and equal bilaterally. No wheezes/rales/rhonchi. Gastrointestinal:   Soft and nontender. Non distended. There is no CVA tenderness.  No rebound, rigidity, or guarding. Musculoskeletal:   Normal range of motion in all extremities. No joint effusions.  No lower extremity tenderness.  No edema. Neurologic:   Normal speech and language.  Motor grossly intact. No acute focal neurologic deficits are appreciated.  Skin:    Skin is warm, dry and intact. No rash noted.  No petechiae, purpura, or bullae.  ____________________________________________    LABS (pertinent positives/negatives) (all labs ordered are listed, but only abnormal results are displayed) Labs Reviewed  COMPREHENSIVE METABOLIC PANEL - Abnormal; Notable for the following components:      Result Value   Glucose, Bld 206 (*)    Calcium 8.8 (*)    All other components within normal limits  BRAIN NATRIURETIC PEPTIDE  TROPONIN I  CBC WITH DIFFERENTIAL/PLATELET    ____________________________________________   EKG  Interpreted by me Sinus rhythm rate of 52, normal axis and intervals.  Incomplete right bundle branch block.  Normal ST segments.  Isolated T wave inversion in lead III which is nonspecific.  Grossly unchanged compared to previous.  ____________________________________________    RADIOLOGY  Dg Chest 2 View  Result Date: 03/07/2018 CLINICAL DATA:  Pt in via POV, reports fever, cough and sore throat since Thursday; Also stating wheezing at night; HTN, diabetes, former smokercough weakness EXAM: CHEST - 2 VIEW COMPARISON:  11/08/17 FINDINGS: Normal cardiac silhouette ectatic aorta. No effusion, infiltrate or pneumothorax. Degenerative osteophytosis of the spine. Degenerate change of the RIGHT shoulder IMPRESSION:  No acute cardiopulmonary process. Electronically Signed   By: Genevive BiStewart  Edmunds M.D.   On: 03/07/2018 12:31    ____________________________________________   PROCEDURES Procedures  ____________________________________________  DIFFERENTIAL DIAGNOSIS   Dehydration, pneumonia, influenza-like illness  CLINICAL IMPRESSION / ASSESSMENT AND PLAN / ED COURSE  Pertinent labs & imaging results that were available during my care of the patient were reviewed by me and considered in my medical decision making (see chart for details).    Patient presents with constellation of symptoms consistent with influenza-like illness/viral syndrome.  Nontoxic, vital signs unremarkable.  Labs unremarkable.  EKG nondiagnostic.  Doubt ACS PE dissection AAA pneumothorax pericarditis or pleural effusion.  No evidence of significant intra-abdominal pathology.  Not septic.  Treat symptomatically with prednisone and Zofran.  He reports tolerating prednisone in the past despite his diabetes.  I will refill his Lexapro and allopurinol.  Counseled to obtain a primary care doctor soon as possible       ____________________________________________   FINAL CLINICAL IMPRESSION(S) / ED DIAGNOSES    Final diagnoses:  Influenza-like illness     ED Discharge Orders         Ordered    escitalopram (LEXAPRO) 20 MG tablet  Daily     03/07/18 1415    allopurinol (ZYLOPRIM) 100 MG tablet  Daily     03/07/18 1415    predniSONE (DELTASONE) 20 MG tablet  Daily     03/07/18 1415    ondansetron (ZOFRAN ODT) 4 MG disintegrating tablet  Every 8 hours PRN     03/07/18 1415          Portions of this note were generated with dragon dictation software. Dictation errors may occur despite best attempts at proofreading.   Sharman CheekStafford, Donnia Poplaski, MD 03/07/18 1421

## 2018-03-07 NOTE — ED Notes (Signed)
NAD noted at time of D/C. Pt denies questions or concerns. Pt ambulatory to the lobby at this time.  

## 2018-03-07 NOTE — ED Triage Notes (Signed)
Pt in via POV, reports fever and sore throat since Thursday.  Vitals WDL, ambulatory to triage, NAD noted at this time.

## 2018-03-19 ENCOUNTER — Encounter: Payer: Self-pay | Admitting: Emergency Medicine

## 2018-03-19 ENCOUNTER — Other Ambulatory Visit: Payer: Self-pay

## 2018-03-19 DIAGNOSIS — R42 Dizziness and giddiness: Secondary | ICD-10-CM | POA: Insufficient documentation

## 2018-03-19 DIAGNOSIS — Z5321 Procedure and treatment not carried out due to patient leaving prior to being seen by health care provider: Secondary | ICD-10-CM | POA: Insufficient documentation

## 2018-03-19 LAB — URINALYSIS, COMPLETE (UACMP) WITH MICROSCOPIC
BACTERIA UA: NONE SEEN
BILIRUBIN URINE: NEGATIVE
Glucose, UA: >=500 mg/dL — AB
Hgb urine dipstick: NEGATIVE
Ketones, ur: NEGATIVE mg/dL
Leukocytes, UA: NEGATIVE
NITRITE: NEGATIVE
PROTEIN: NEGATIVE mg/dL
SPECIFIC GRAVITY, URINE: 1.001 — AB (ref 1.005–1.030)
Squamous Epithelial / LPF: NONE SEEN (ref 0–5)
pH: 6 (ref 5.0–8.0)

## 2018-03-19 LAB — COMPREHENSIVE METABOLIC PANEL
ALBUMIN: 4 g/dL (ref 3.5–5.0)
ALK PHOS: 92 U/L (ref 38–126)
ALT: 17 U/L (ref 0–44)
AST: 17 U/L (ref 15–41)
Anion gap: 7 (ref 5–15)
BUN: 17 mg/dL (ref 6–20)
CALCIUM: 8.7 mg/dL — AB (ref 8.9–10.3)
CO2: 27 mmol/L (ref 22–32)
Chloride: 103 mmol/L (ref 98–111)
Creatinine, Ser: 0.68 mg/dL (ref 0.61–1.24)
GFR calc Af Amer: >60 mL/min (ref >60)
GFR calc non Af Amer: >60 mL/min (ref >60)
GLUCOSE: 212 mg/dL — AB (ref 70–99)
Potassium: 3.9 mmol/L (ref 3.5–5.1)
SODIUM: 137 mmol/L (ref 135–145)
Total Bilirubin: 0.4 mg/dL (ref 0.3–1.2)
Total Protein: 7.6 g/dL (ref 6.5–8.1)

## 2018-03-19 LAB — CBC WITH DIFFERENTIAL/PLATELET
ABS IMMATURE GRANULOCYTES: 0.01 10*3/uL (ref 0.00–0.07)
BASOS ABS: 0.1 10*3/uL (ref 0.0–0.1)
Basophils Relative: 1 %
EOS PCT: 5 %
Eosinophils Absolute: 0.3 10*3/uL (ref 0.0–0.5)
HEMATOCRIT: 41.5 % (ref 39.0–52.0)
HEMOGLOBIN: 13.8 g/dL (ref 13.0–17.0)
IMMATURE GRANULOCYTES: 0 %
LYMPHS ABS: 1.6 10*3/uL (ref 0.7–4.0)
LYMPHS PCT: 24 %
MCH: 29.6 pg (ref 26.0–34.0)
MCHC: 33.3 g/dL (ref 30.0–36.0)
MCV: 88.9 fL (ref 80.0–100.0)
Monocytes Absolute: 0.5 10*3/uL (ref 0.1–1.0)
Monocytes Relative: 8 %
NEUTROS ABS: 4.3 10*3/uL (ref 1.7–7.7)
NEUTROS PCT: 62 %
NRBC: 0 % (ref 0.0–0.2)
Platelets: 279 10*3/uL (ref 150–400)
RBC: 4.67 MIL/uL (ref 4.22–5.81)
RDW: 12.5 % (ref 11.5–15.5)
WBC: 6.9 10*3/uL (ref 4.0–10.5)

## 2018-03-19 LAB — GLUCOSE, CAPILLARY: GLUCOSE-CAPILLARY: 220 mg/dL — AB (ref 70–99)

## 2018-03-19 LAB — TROPONIN I: Troponin I: <0.03 ng/mL (ref <0.03)

## 2018-03-19 NOTE — ED Triage Notes (Signed)
Pt to triage via w/c with no distress noted; st FSBS 300 at work; took glipizide and glucotrol this am; Pt reports blurred vision & dizziness, "unsteady"; denies pain

## 2018-03-20 ENCOUNTER — Emergency Department
Admission: EM | Admit: 2018-03-20 | Discharge: 2018-03-20 | Disposition: A | Discharge status: Home / Self Care | Payer: Self-pay | Attending: Emergency Medicine | Admitting: Emergency Medicine

## 2019-06-12 ENCOUNTER — Emergency Department (HOSPITAL_COMMUNITY)
Admission: EM | Admit: 2019-06-12 | Discharge: 2019-06-13 | Disposition: A | Discharge status: Home / Self Care | Payer: Self-pay | Attending: Emergency Medicine | Admitting: Emergency Medicine

## 2019-06-12 ENCOUNTER — Other Ambulatory Visit: Payer: Self-pay

## 2019-06-12 DIAGNOSIS — Z87891 Personal history of nicotine dependence: Secondary | ICD-10-CM | POA: Insufficient documentation

## 2019-06-12 DIAGNOSIS — I10 Essential (primary) hypertension: Secondary | ICD-10-CM | POA: Insufficient documentation

## 2019-06-12 DIAGNOSIS — F19920 Other psychoactive substance use, unspecified with intoxication, uncomplicated: Secondary | ICD-10-CM | POA: Insufficient documentation

## 2019-06-12 DIAGNOSIS — F329 Major depressive disorder, single episode, unspecified: Secondary | ICD-10-CM | POA: Insufficient documentation

## 2019-06-12 DIAGNOSIS — E119 Type 2 diabetes mellitus without complications: Secondary | ICD-10-CM | POA: Insufficient documentation

## 2019-06-12 NOTE — ED Provider Notes (Signed)
[facility]-EMERGENCY DEPT [facility] Emergency Department Provider Note MRN:  [id number]  Arrival date & time: 06/13/19     Chief Complaint   Altered mental status History of Present Illness   Dennis Pineda is a 59 y.o. year-old male with a history of depression, diabetes, hypertension presenting to the ED with chief complaint of altered mental status.  Found at [facility] somnolent, reportedly took 2 Xanax.  Not responding to questions appropriately.  I was unable to obtain an accurate HPI, PMH, or ROS due to the patient's altered mental status.  Level 5 caveat.  Review of Systems  Positive for altered mental status.  Patient's Health History    Past Medical History:  Diagnosis Date  . Arthritis    gout  . Depression   . Diabetes mellitus without complication (HCC)   . Hypertension     Past Surgical History:  Procedure Laterality Date  . APPENDECTOMY    . KNEE SURGERY    . ROTATOR CUFF REPAIR    . SHOULDER SURGERY      Family History  Problem Relation Age of Onset  . Diabetes Mother   . Cancer Father        Liver  . Diabetes Father   . Diabetes Sister   . Diabetes Maternal Grandmother     Social History   Socioeconomic History  . Marital status: Married    Spouse name: Not on file  . Number of children: Not on file  . Years of education: Not on file  . Highest education level: Not on file  Occupational History  . Not on file  Tobacco Use  . Smoking status: Former Smoker    Types: Cigarettes  . Smokeless tobacco: Never Used  Substance and Sexual Activity  . Alcohol use: No    Alcohol/week: 0.0 standard drinks  . Drug use: No  . Sexual activity: Yes  Other Topics Concern  . Not on file  Social History Narrative   Lives with wife, works, drives.   Social Determinants of Health   Financial Resource Strain:   . Difficulty of Paying Living Expenses:   Food Insecurity:   . Worried About Programme researcher, broadcasting/film/video in the Last Year:   . Barista in  the Last Year:   Transportation Needs:   . Freight forwarder (Medical):   Marland Kitchen Lack of Transportation (Non-Medical):   Physical Activity:   . Days of Exercise per Week:   . Minutes of Exercise per Session:   Stress:   . Feeling of Stress :   Social Connections:   . Frequency of Communication with Friends and Family:   . Frequency of Social Gatherings with Friends and Family:   . Attends Religious Services:   . Active Member of Clubs or Organizations:   . Attends Banker Meetings:   Marland Kitchen Marital Status:   Intimate Partner Violence:   . Fear of Current or Ex-Partner:   . Emotionally Abused:   Marland Kitchen Physically Abused:   . Sexually Abused:      Physical Exam   Vitals:   06/13/19 0400 06/13/19 0500  BP: 112/77 103/73  Pulse: 65 65  Resp: 18 17  SpO2: 96% 93%    CONSTITUTIONAL: Well-appearing, NAD NEURO: Somnolent, wakes to painful stimuli, moves all extremities, oriented only to name EYES:  eyes equal and reactive ENT/NECK:  no LAD, no JVD CARDIO: Regular rate, well-perfused, normal S1 and S2 PULM:  CTAB no wheezing or rhonchi GI/GU:  normal bowel sounds, non-distended, non-tender MSK/SPINE:  No gross deformities, no edema SKIN:  no rash, atraumatic PSYCH: Unable to assess due to altered mental status  *Additional and/or pertinent findings included in MDM below  Diagnostic and Interventional Summary    EKG Interpretation  Date/Time:  Thursday June 13 2019 00:03:51 EDT Ventricular Rate:  62 PR Interval:    QRS Duration: 111 QT Interval:  465 QTC Calculation: 473 R Axis:   92 Text Interpretation: Sinus rhythm Consider right ventricular hypertrophy No significant change was found Confirmed by Gerlene Fee [phone]) on 06/13/2019 12:39:32 AM      Labs Reviewed  CBC - Abnormal; Notable for the following components:      Result Value   Hemoglobin 12.8 (*)    All other components within normal limits  COMPREHENSIVE METABOLIC PANEL - Abnormal; Notable for  the following components:   Glucose, Bld 176 (*)    AST 113 (*)    All other components within normal limits  SALICYLATE LEVEL - Abnormal; Notable for the following components:   Salicylate Lvl <8.4 (*)    All other components within normal limits  ACETAMINOPHEN LEVEL - Abnormal; Notable for the following components:   Acetaminophen (Tylenol), Serum <10 (*)    All other components within normal limits  URINALYSIS, ROUTINE W REFLEX MICROSCOPIC - Abnormal; Notable for the following components:   Color, Urine AMBER (*)    APPearance CLOUDY (*)    All other components within normal limits  RAPID URINE DRUG SCREEN, HOSP PERFORMED - Abnormal; Notable for the following components:   Opiates POSITIVE (*)    Cocaine POSITIVE (*)    Benzodiazepines POSITIVE (*)    All other components within normal limits  ETHANOL    CT HEAD WO CONTRAST  Final Result    DG Chest Port 1 View  Final Result      Medications - No data to display   Procedures  /  Critical Care Procedures  ED Course and Medical Decision Making  I have reviewed the triage vital signs, the nursing notes, and pertinent available records from the EMR.  Listed above are laboratory and imaging tests that I personally ordered, reviewed, and interpreted and then considered in my medical decision making (see below for details).      Altered mental status, report of some benzodiazepine use.  Pupils are 3 mm, no other toxidrome features, very little history that I am able to obtain at this time.  Broad differential, will obtain labs, CT head, urinalysis, UDS, monitor closely.  Upon reassessment at 6:30 AM patient continues to be somnolent, mumbles short phrases that largely do not make sense.  Still not ready for discharge, still needs time for metabolization.  Work-up is reassuring, UDS positive for multiple illicit substances.  Signed out to oncoming provider at shift change.  Anticipating discharge when he is more lucid.  Barth Kirks. Sedonia Small, [place] [email or web address]  Final Clinical Impressions(s) / ED Diagnoses     ICD-10-CM   1. Drug intoxication without complication [facility])  X32.440     ED Discharge Orders    None       Discharge Instructions Discussed with and Provided to Patient:   Discharge Instructions   None       Maudie Flakes, MD 06/13/19 0630

## 2019-06-12 NOTE — ED Triage Notes (Signed)
PT BIB [facility] from [facility], pt was found to be very lethargic and had some difficulty answering questions. Pt poor historian at this time as he falls asleep mid sentence per EMS. Pt stated he took two xanax. Pt does not have any complaints of pain at this time.   Vitals: BP 108/64 HR 66 RR 16 spo2 95% room air cbg 137

## 2019-06-13 ENCOUNTER — Other Ambulatory Visit: Payer: Self-pay

## 2019-06-13 ENCOUNTER — Emergency Department (HOSPITAL_COMMUNITY): Payer: Self-pay

## 2019-06-13 ENCOUNTER — Encounter (HOSPITAL_COMMUNITY): Payer: Self-pay | Admitting: Emergency Medicine

## 2019-06-13 LAB — SALICYLATE LEVEL: Salicylate Lvl: <7 mg/dL — ABNORMAL LOW (ref 7.0–30.0)

## 2019-06-13 LAB — COMPREHENSIVE METABOLIC PANEL
ALT: 43 U/L (ref 0–44)
AST: 113 U/L — ABNORMAL HIGH (ref 15–41)
Albumin: 3.8 g/dL (ref 3.5–5.0)
Alkaline Phosphatase: 90 U/L (ref 38–126)
Anion gap: 9 (ref 5–15)
BUN: 13 mg/dL (ref 6–20)
CO2: 28 mmol/L (ref 22–32)
Calcium: 9.6 mg/dL (ref 8.9–10.3)
Chloride: 99 mmol/L (ref 98–111)
Creatinine, Ser: 0.84 mg/dL (ref 0.61–1.24)
GFR calc Af Amer: >60 mL/min (ref >60)
GFR calc non Af Amer: >60 mL/min (ref >60)
Glucose, Bld: 176 mg/dL — ABNORMAL HIGH (ref 70–99)
Potassium: 3.5 mmol/L (ref 3.5–5.1)
Sodium: 136 mmol/L (ref 135–145)
Total Bilirubin: 0.8 mg/dL (ref 0.3–1.2)
Total Protein: 7.1 g/dL (ref 6.5–8.1)

## 2019-06-13 LAB — URINALYSIS, ROUTINE W REFLEX MICROSCOPIC
Bilirubin Urine: NEGATIVE
Glucose, UA: NEGATIVE mg/dL
Hgb urine dipstick: NEGATIVE
Ketones, ur: NEGATIVE mg/dL
Leukocytes,Ua: NEGATIVE
Nitrite: NEGATIVE
Protein, ur: NEGATIVE mg/dL
Specific Gravity, Urine: 1.013 (ref 1.005–1.030)
pH: 6 (ref 5.0–8.0)

## 2019-06-13 LAB — CBC
HCT: 39 % (ref 39.0–52.0)
Hemoglobin: 12.8 g/dL — ABNORMAL LOW (ref 13.0–17.0)
MCH: 30.2 pg (ref 26.0–34.0)
MCHC: 32.8 g/dL (ref 30.0–36.0)
MCV: 92 fL (ref 80.0–100.0)
Platelets: 255 10*3/uL (ref 150–400)
RBC: 4.24 MIL/uL (ref 4.22–5.81)
RDW: 12.4 % (ref 11.5–15.5)
WBC: 7.7 10*3/uL (ref 4.0–10.5)
nRBC: 0 % (ref 0.0–0.2)

## 2019-06-13 LAB — RAPID URINE DRUG SCREEN, HOSP PERFORMED
Amphetamines: NOT DETECTED
Barbiturates: NOT DETECTED
Benzodiazepines: POSITIVE — AB
Cocaine: POSITIVE — AB
Opiates: POSITIVE — AB
Tetrahydrocannabinol: NOT DETECTED

## 2019-06-13 LAB — ETHANOL: Alcohol, Ethyl (B): <10 mg/dL (ref <10)

## 2019-06-13 LAB — ACETAMINOPHEN LEVEL: Acetaminophen (Tylenol), Serum: <10 ug/mL — ABNORMAL LOW (ref 10–30)

## 2019-06-13 NOTE — ED Notes (Signed)
Re-eval by EDP.

## 2019-06-13 NOTE — ED Notes (Signed)
Pt transported to CT ?

## 2019-06-13 NOTE — ED Notes (Signed)
Pt ambulated with Molly Maduro, NT from room to ambulance bay. Per tech, pt became dizzy at ambulance bay but was able to walk back to room without incident. Dr. Criss Alvine made aware and is at bedside to eval pt.

## 2019-06-13 NOTE — ED Notes (Signed)
WHILE AMBULATING PATIENT BECAME LIGHTHEADED AND DIZZY ON THE WAY BACK TO HIS ROOM.

## 2019-06-13 NOTE — ED Notes (Signed)
Enters my care. Report from night shift RN. Pt asleep in stretcher. Arousable to gentle shaking. Pt opens eyes, mumbles then closes eyes again. Pt unable to state his name or answer questions appropriately at this time. VSS. Skin w/d/pink. Resp wnl, equal and non-labored. NAD noted. Cont to monitor.

## 2019-06-13 NOTE — ED Provider Notes (Signed)
Care transferred to me. Patient is now awake and alert. Oriented to person, place, time, president. He denies specific drug overdose, those has a history of opiate abuse and cocaine, benzos in UDS.  However he is now awake, alert, and ambulatory.  Ate breakfast.  He felt a little dizzy while walking but otherwise appears stable for discharge home. Presentation most consistent with overdose. He denies any intentional OD or self-harm   Pricilla Loveless, MD 06/13/19 1028

## 2019-06-13 NOTE — ED Notes (Signed)
Eating a [place] sandwich with ginger ale.

## 2019-06-18 ENCOUNTER — Encounter: Payer: Self-pay | Admitting: Medical Oncology

## 2019-06-18 ENCOUNTER — Emergency Department: Payer: No Typology Code available for payment source

## 2019-06-18 ENCOUNTER — Other Ambulatory Visit: Payer: Self-pay

## 2019-06-18 ENCOUNTER — Emergency Department
Admission: EM | Admit: 2019-06-18 | Discharge: 2019-06-18 | Disposition: A | Discharge status: Home / Self Care | Payer: No Typology Code available for payment source | Attending: Emergency Medicine | Admitting: Emergency Medicine

## 2019-06-18 DIAGNOSIS — S0990XA Unspecified injury of head, initial encounter: Secondary | ICD-10-CM | POA: Diagnosis present

## 2019-06-18 DIAGNOSIS — Z79899 Other long term (current) drug therapy: Secondary | ICD-10-CM | POA: Diagnosis not present

## 2019-06-18 DIAGNOSIS — Z87891 Personal history of nicotine dependence: Secondary | ICD-10-CM | POA: Diagnosis not present

## 2019-06-18 DIAGNOSIS — Y92012 Bathroom of single-family (private) house as the place of occurrence of the external cause: Secondary | ICD-10-CM | POA: Insufficient documentation

## 2019-06-18 DIAGNOSIS — I10 Essential (primary) hypertension: Secondary | ICD-10-CM | POA: Insufficient documentation

## 2019-06-18 DIAGNOSIS — W19XXXA Unspecified fall, initial encounter: Secondary | ICD-10-CM | POA: Insufficient documentation

## 2019-06-18 DIAGNOSIS — Y939 Activity, unspecified: Secondary | ICD-10-CM | POA: Diagnosis not present

## 2019-06-18 DIAGNOSIS — F1910 Other psychoactive substance abuse, uncomplicated: Secondary | ICD-10-CM

## 2019-06-18 DIAGNOSIS — S0083XA Contusion of other part of head, initial encounter: Secondary | ICD-10-CM | POA: Insufficient documentation

## 2019-06-18 DIAGNOSIS — Y999 Unspecified external cause status: Secondary | ICD-10-CM | POA: Diagnosis not present

## 2019-06-18 DIAGNOSIS — R4182 Altered mental status, unspecified: Secondary | ICD-10-CM | POA: Diagnosis not present

## 2019-06-18 DIAGNOSIS — E119 Type 2 diabetes mellitus without complications: Secondary | ICD-10-CM | POA: Diagnosis not present

## 2019-06-18 LAB — URINALYSIS, COMPLETE (UACMP) WITH MICROSCOPIC
Bacteria, UA: NONE SEEN
Bilirubin Urine: NEGATIVE
Glucose, UA: NEGATIVE mg/dL
Hgb urine dipstick: NEGATIVE
Ketones, ur: NEGATIVE mg/dL
Leukocytes,Ua: NEGATIVE
Nitrite: NEGATIVE
Protein, ur: NEGATIVE mg/dL
Specific Gravity, Urine: 1.009 (ref 1.005–1.030)
pH: 7 (ref 5.0–8.0)

## 2019-06-18 LAB — CBC WITH DIFFERENTIAL/PLATELET
Abs Immature Granulocytes: 0.02 10*3/uL (ref 0.00–0.07)
Basophils Absolute: 0.1 10*3/uL (ref 0.0–0.1)
Basophils Relative: 1 %
Eosinophils Absolute: 0.2 10*3/uL (ref 0.0–0.5)
Eosinophils Relative: 2 %
HCT: 40.4 % (ref 39.0–52.0)
Hemoglobin: 13.7 g/dL (ref 13.0–17.0)
Immature Granulocytes: 0 %
Lymphocytes Relative: 11 %
Lymphs Abs: 1 10*3/uL (ref 0.7–4.0)
MCH: 30.2 pg (ref 26.0–34.0)
MCHC: 33.9 g/dL (ref 30.0–36.0)
MCV: 89.2 fL (ref 80.0–100.0)
Monocytes Absolute: 0.6 10*3/uL (ref 0.1–1.0)
Monocytes Relative: 6 %
Neutro Abs: 7.3 10*3/uL (ref 1.7–7.7)
Neutrophils Relative %: 80 %
Platelets: 293 10*3/uL (ref 150–400)
RBC: 4.53 MIL/uL (ref 4.22–5.81)
RDW: 12.1 % (ref 11.5–15.5)
WBC: 9.1 10*3/uL (ref 4.0–10.5)
nRBC: 0 % (ref 0.0–0.2)

## 2019-06-18 LAB — COMPREHENSIVE METABOLIC PANEL
ALT: 29 U/L (ref 0–44)
AST: 35 U/L (ref 15–41)
Albumin: 4 g/dL (ref 3.5–5.0)
Alkaline Phosphatase: 99 U/L (ref 38–126)
Anion gap: 8 (ref 5–15)
BUN: 14 mg/dL (ref 6–20)
CO2: 28 mmol/L (ref 22–32)
Calcium: 9.2 mg/dL (ref 8.9–10.3)
Chloride: 103 mmol/L (ref 98–111)
Creatinine, Ser: 0.91 mg/dL (ref 0.61–1.24)
GFR calc Af Amer: >60 mL/min (ref >60)
GFR calc non Af Amer: >60 mL/min (ref >60)
Glucose, Bld: 147 mg/dL — ABNORMAL HIGH (ref 70–99)
Potassium: 4.5 mmol/L (ref 3.5–5.1)
Sodium: 139 mmol/L (ref 135–145)
Total Bilirubin: 0.9 mg/dL (ref 0.3–1.2)
Total Protein: 7.9 g/dL (ref 6.5–8.1)

## 2019-06-18 LAB — ETHANOL: Alcohol, Ethyl (B): <10 mg/dL (ref <10)

## 2019-06-18 LAB — URINE DRUG SCREEN, QUALITATIVE (ARMC ONLY)
Amphetamines, Ur Screen: NOT DETECTED
Barbiturates, Ur Screen: NOT DETECTED
Benzodiazepine, Ur Scrn: POSITIVE — AB
Cannabinoid 50 Ng, Ur ~~LOC~~: NOT DETECTED
Cocaine Metabolite,Ur ~~LOC~~: POSITIVE — AB
MDMA (Ecstasy)Ur Screen: NOT DETECTED
Methadone Scn, Ur: NOT DETECTED
Opiate, Ur Screen: NOT DETECTED
Phencyclidine (PCP) Ur S: NOT DETECTED
Tricyclic, Ur Screen: NOT DETECTED

## 2019-06-18 LAB — CK: Total CK: 525 U/L — ABNORMAL HIGH (ref 49–397)

## 2019-06-18 LAB — SALICYLATE LEVEL: Salicylate Lvl: <7 mg/dL — ABNORMAL LOW (ref 7.0–30.0)

## 2019-06-18 LAB — ACETAMINOPHEN LEVEL: Acetaminophen (Tylenol), Serum: <10 ug/mL — ABNORMAL LOW (ref 10–30)

## 2019-06-18 MED ORDER — SODIUM CHLORIDE 0.9 % IV BOLUS
1000.0000 mL | Freq: Once | INTRAVENOUS | Status: AC
  Administered 2019-06-18: 1000 mL via INTRAVENOUS
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
## 2019-06-18 NOTE — ED Notes (Signed)
Pt transported to CT ?
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
## 2019-06-18 NOTE — ED Notes (Signed)
Pt ambulated with this Clinical research associate and Geraldine Contras, Charity fundraiser. Pt ambulated with a somewhat steady gait to the nurses desk and back to the stretcher.

## 2019-06-18 NOTE — Discharge Instructions (Signed)
Labs and CT were reassuring.  Try to cut down or stop using illegal substances.  We have given you resources to help.  Return the ER for any other concerns    IMPRESSION: CT head:   1. No evidence of acute intracranial abnormality. 2. Minimal ethmoid and maxillary sinus mucosal thickening.   CT cervical spine:   1. No evidence of acute fracture to the cervical spine. 2. Cervical spondylosis as described. Most notably at C3-C4, a posterior disc osteophyte complex contributes to suspected at least mild/moderate spinal canal stenosis. 3. Mild multilevel grade 1 spondylolisthesis which is nonspecific, but may be degenerative.

## 2019-06-18 NOTE — ED Notes (Signed)
Pt up on own walking around in room, appears steady on feet at this time.

## 2019-09-08 IMAGING — CR DG LUMBAR SPINE 2-3V
1 series · 4 of 4 positions shown · non-contrast
Comparison: None.

CLINICAL DATA: Acute lumbar back pain

EXAM:
LUMBAR SPINE - 2-3 VIEW

[Series 1: dg lumbar spine 2-3 views · 0.14mm/px · 4 of 4 slices shown]
[im 1/4]
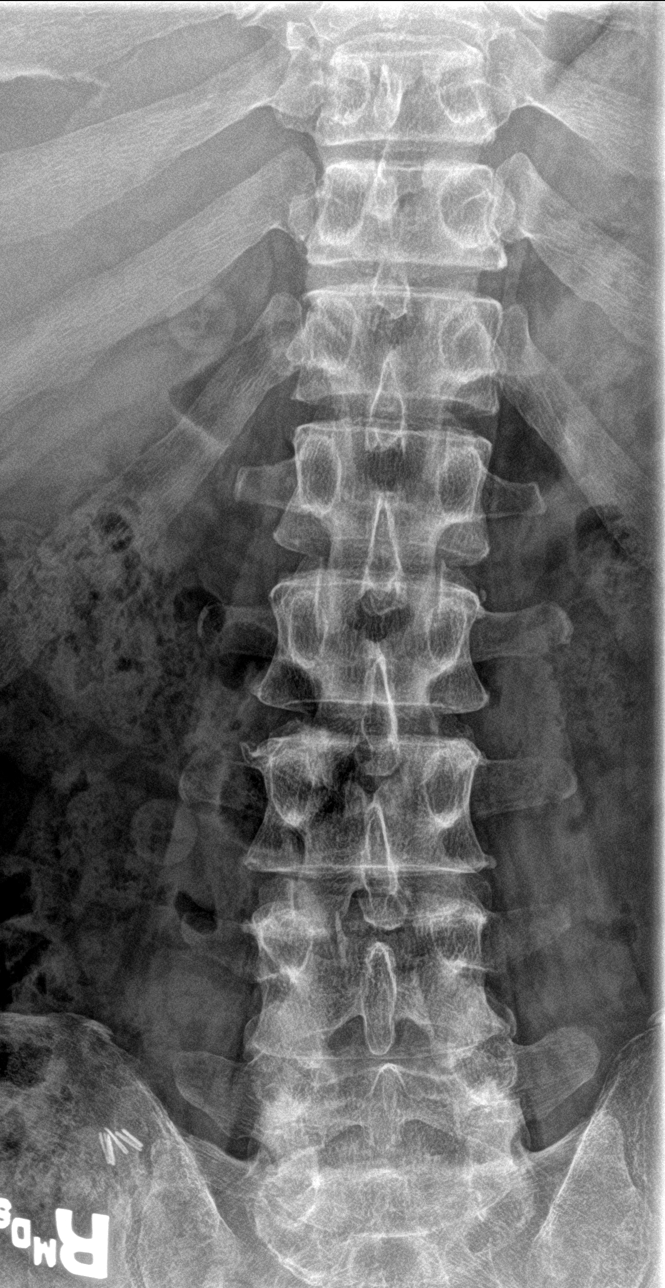
[im 2/4]
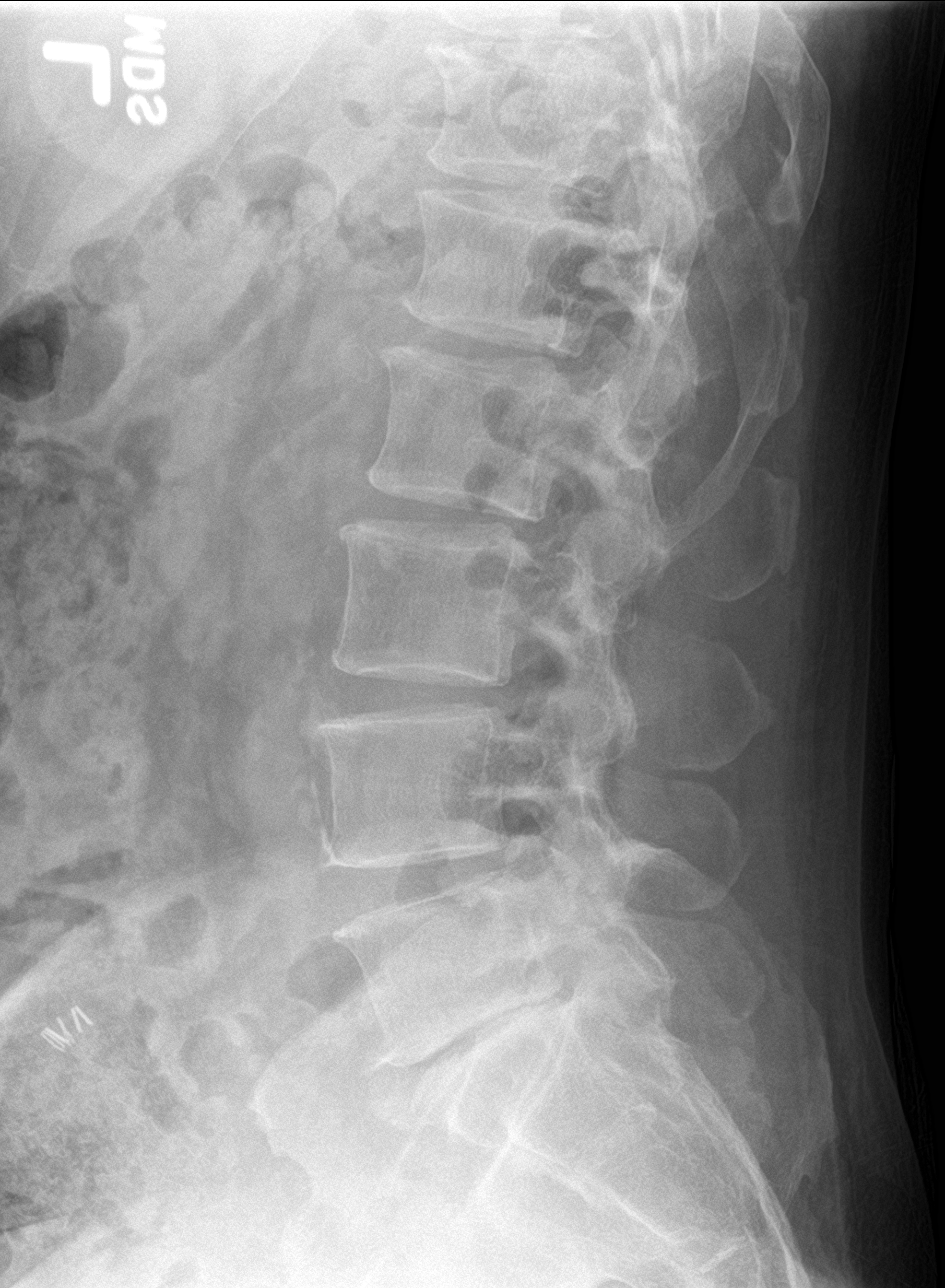
[im 3/4]
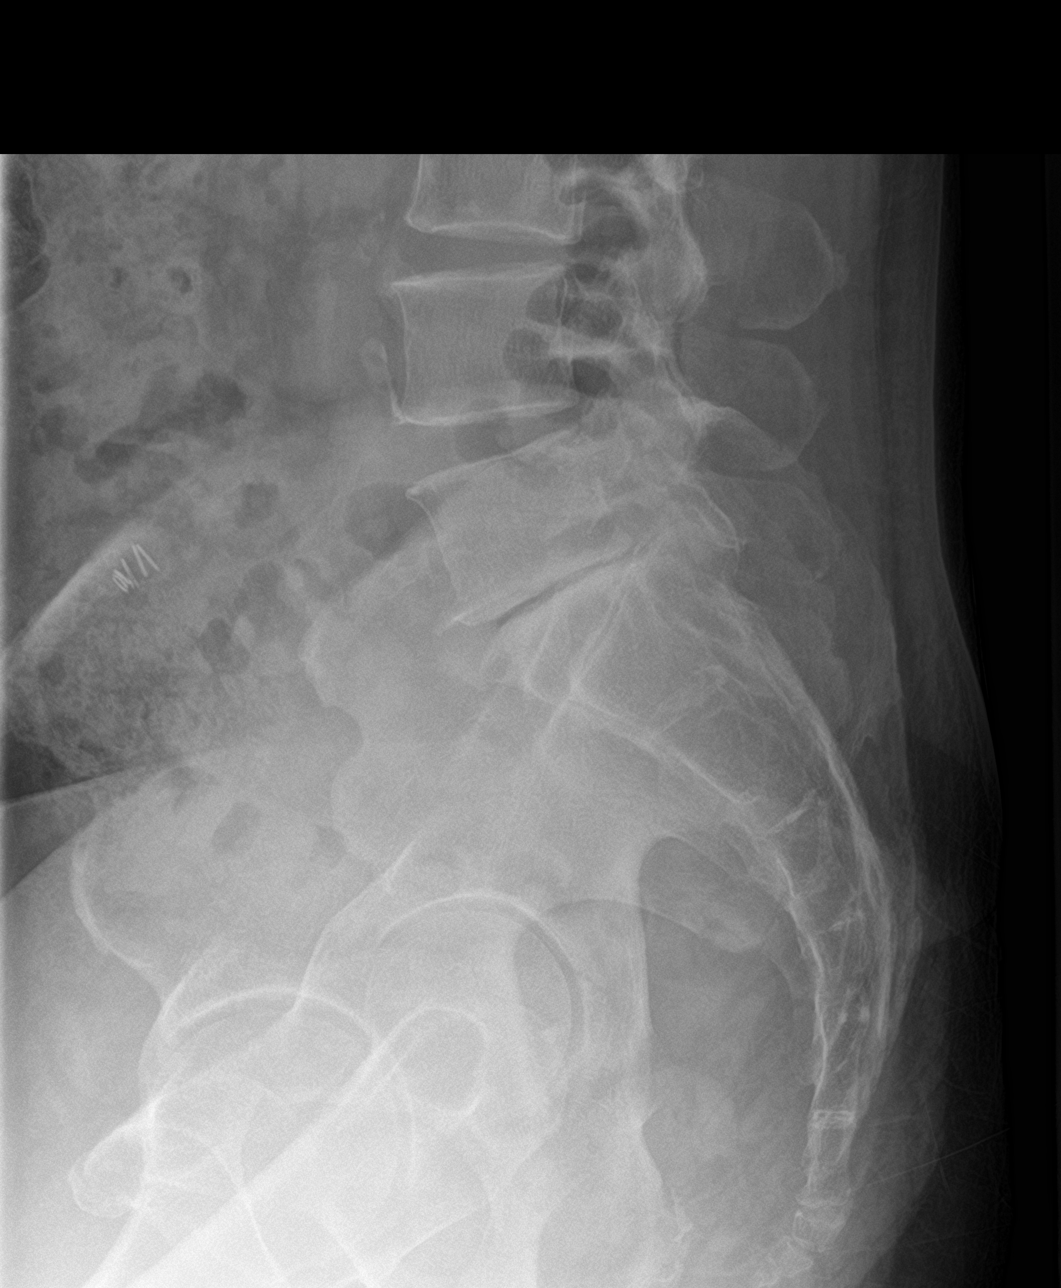
[im 4/4]
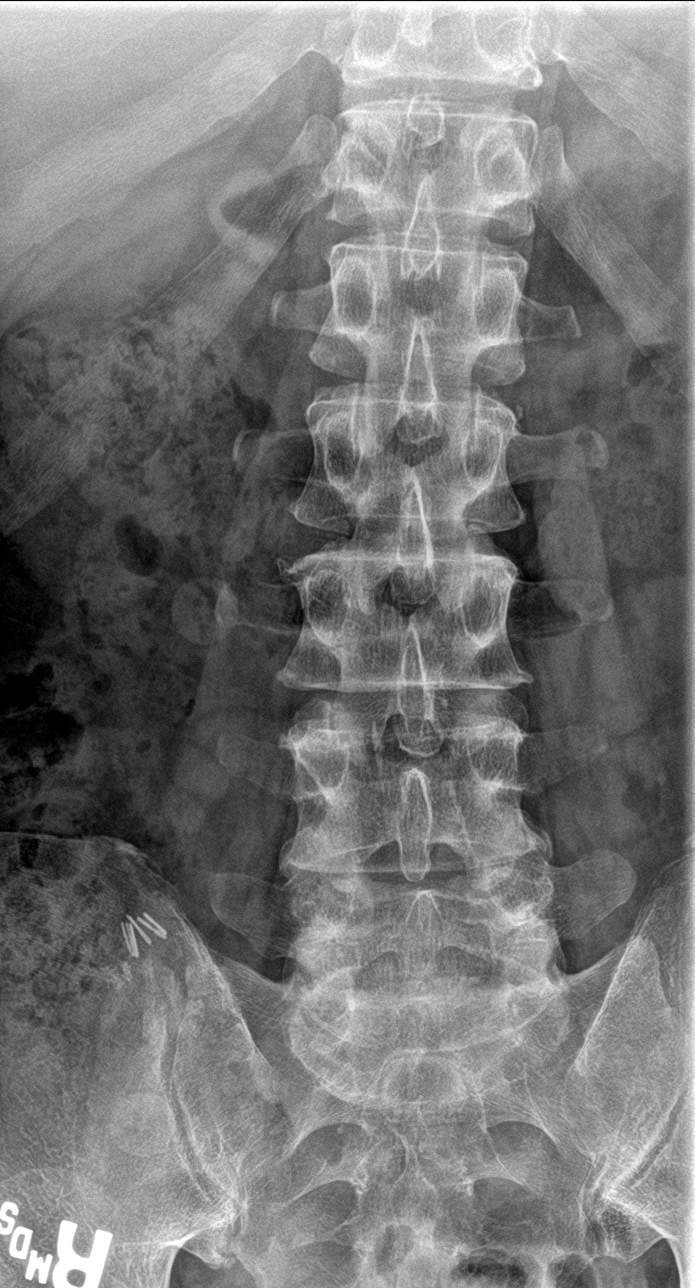

[4 of 4 positions shown; findings below may reference images not displayed]

FINDINGS: Normal alignment. Minor endplate degenerative changes. Severe L5-S1
degenerative disc disease with disc space narrowing, sclerosis and
osteophytes. No acute compression fracture, wedge-shaped deformity
or focal kyphosis. Pedicles intact. Normal SI joints for age. Aorta
is atherosclerotic. Postop clips in the right lower quadrant.
IMPRESSION: Lumbar degenerative disc disease, most pronounced at L5-S1. No acute
finding by plain radiography

Aortic atherosclerosis

## 2019-09-13 IMAGING — CT CT HEAD W/O CM
3 series · 15 of 47 positions shown, 18 images · non-contrast
Comparison: CT brain 11/24/2013

CLINICAL DATA: Altered mental status with dizziness

EXAM:
CT HEAD WITHOUT CONTRAST
TECHNIQUE: Contiguous axial images were obtained from the base of the skull
through the vertex without intravenous contrast.

[Series 2: head wo · axial · 0.47mm/px · z∈[-155,-30]mm · 9 of 30 slices shown, 12 images]
[im 3/30  brain]
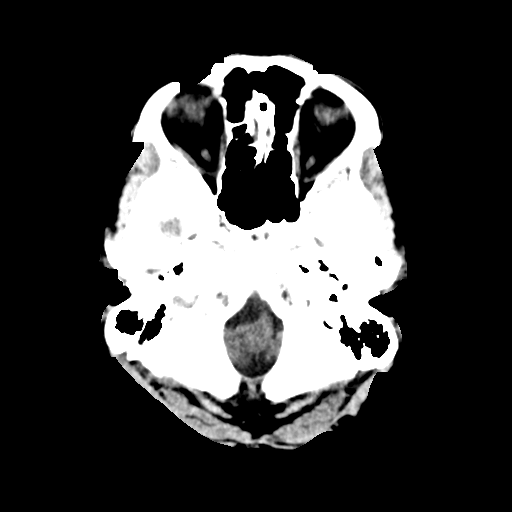
[im 3/30  bone]
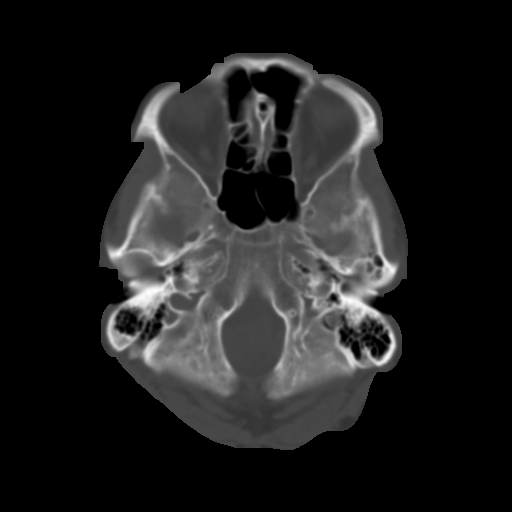
[im 6/30  brain]
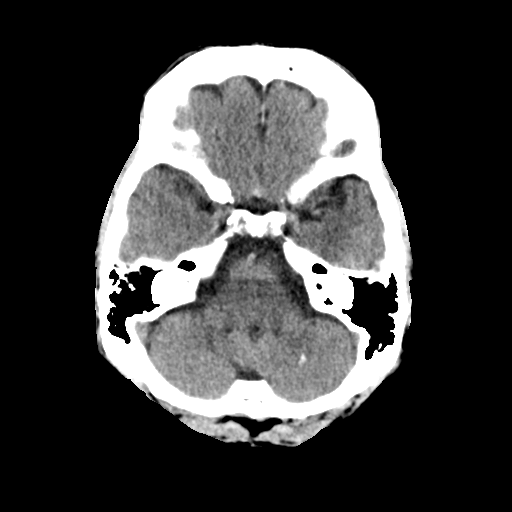
[im 9/30  brain]
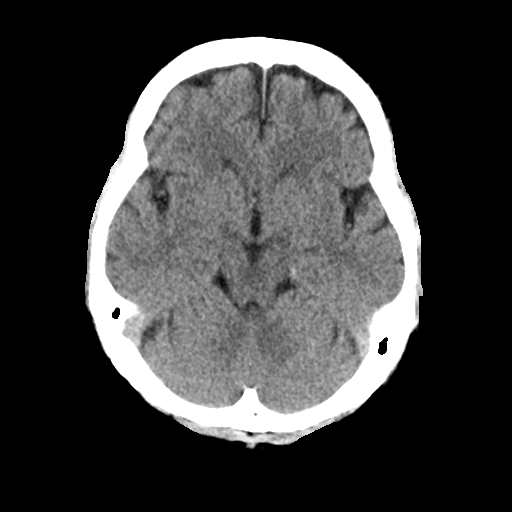
[im 12/30  brain]
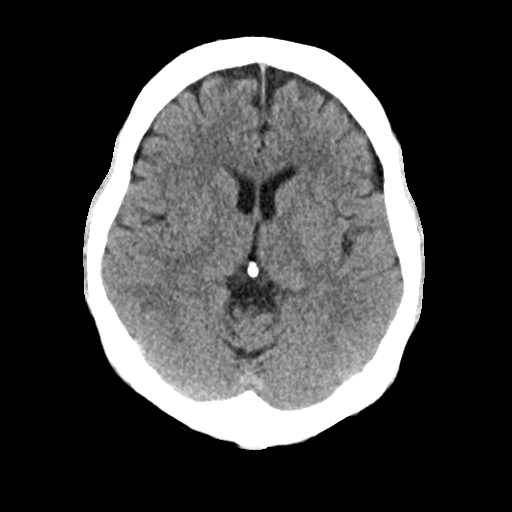
[im 16/30  brain]
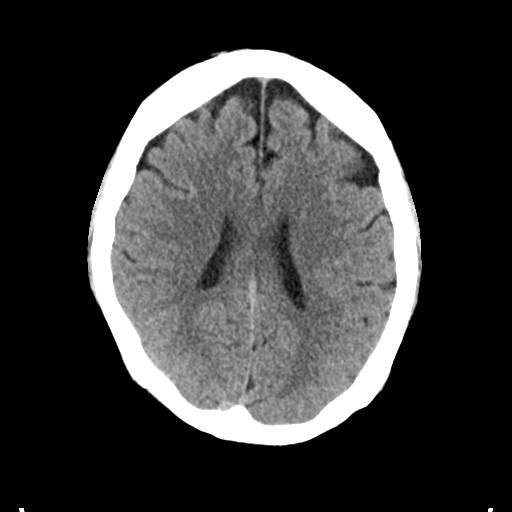
[im 16/30  bone]
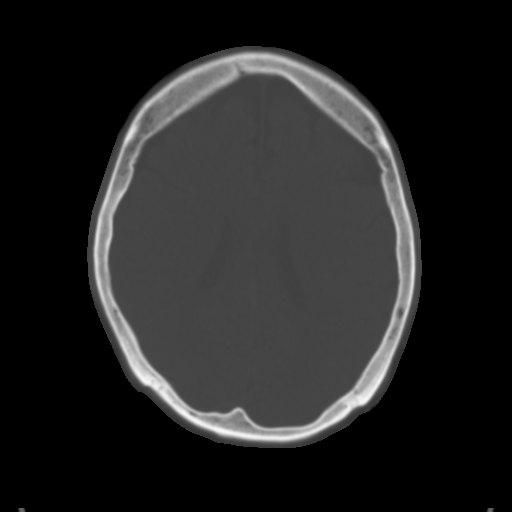
[im 19/30  brain]
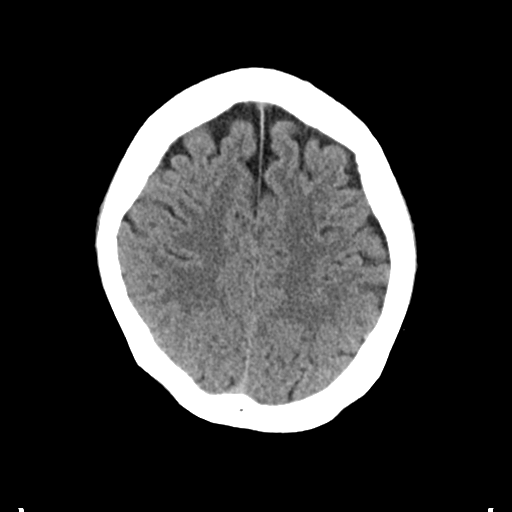
[im 22/30  brain]
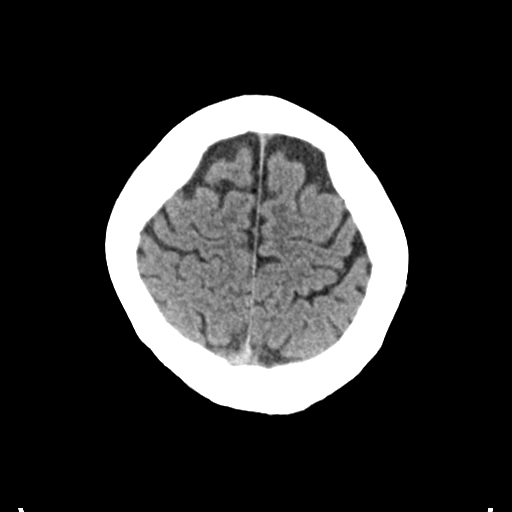
[im 25/30  brain]
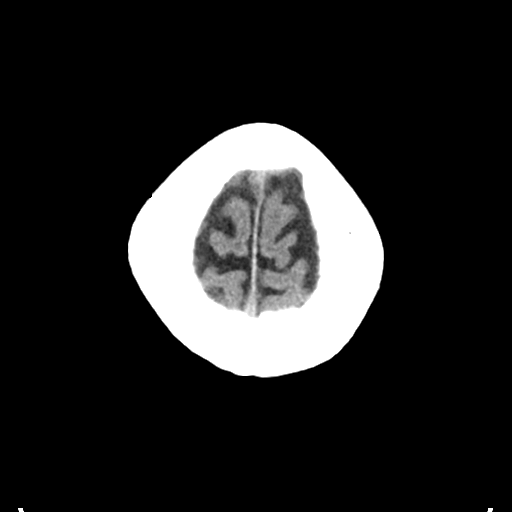
[im 28/30  brain]
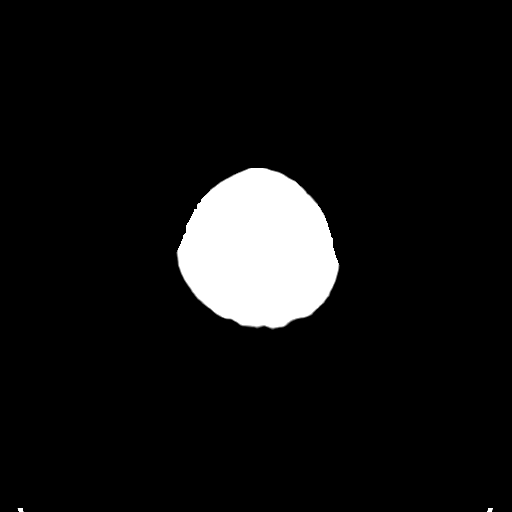
[im 28/30  bone]
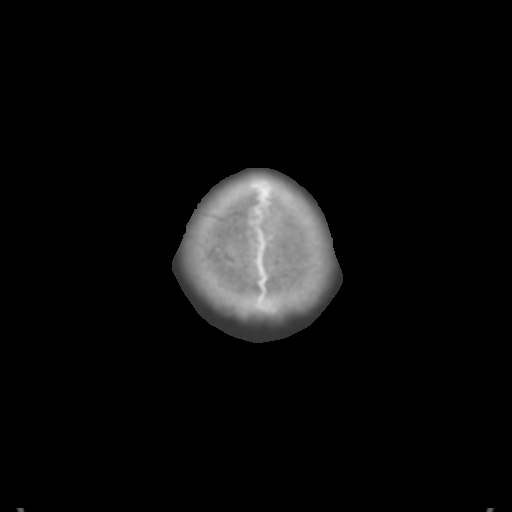

[Series 4: coronal soft tissue · coronal · 0.29mm/px · 3 of 65 slices shown]
[im 22/65  brain]
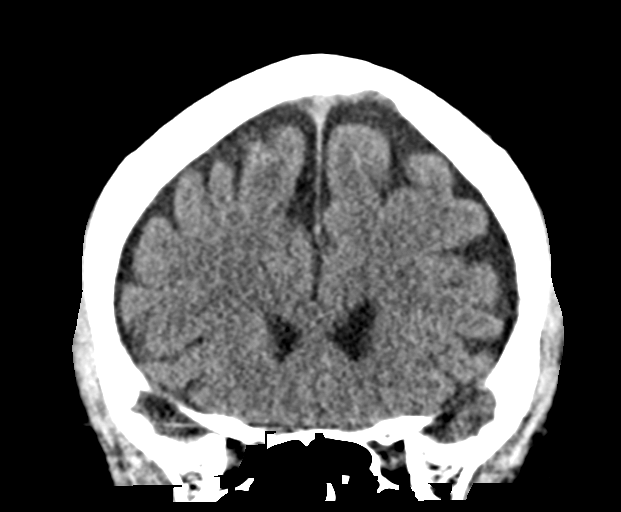
[im 29/65  brain]
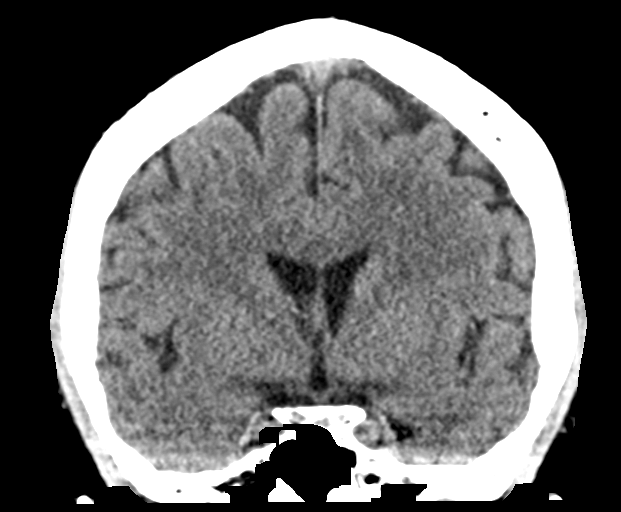
[im 36/65  brain]
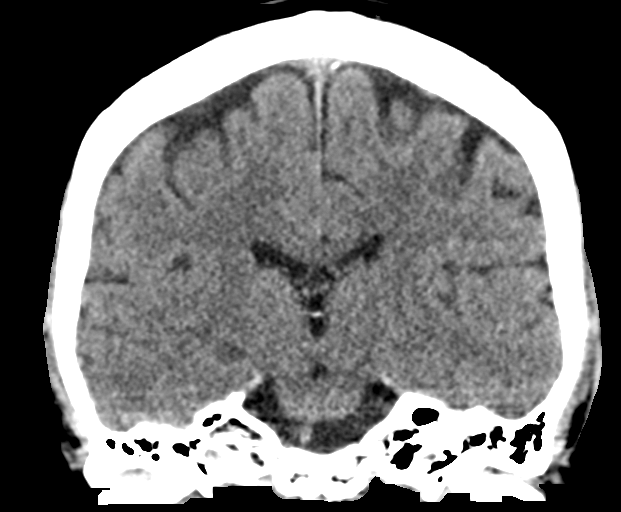

[Series 5: sagittal soft tissue · sagittal · 0.31mm/px · 3 of 55 slices shown]
[im 19/55  brain]
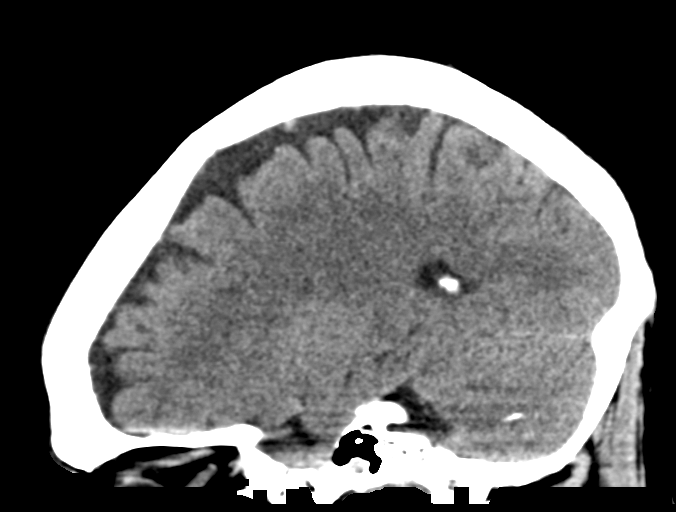
[im 28/55  brain]
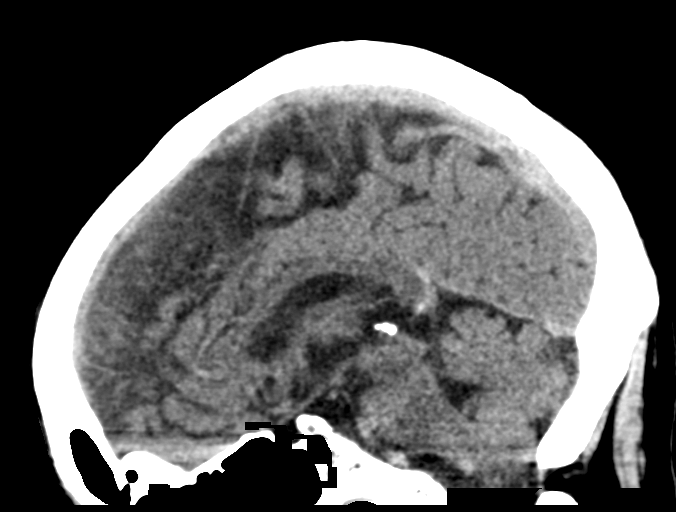
[im 37/55  brain]
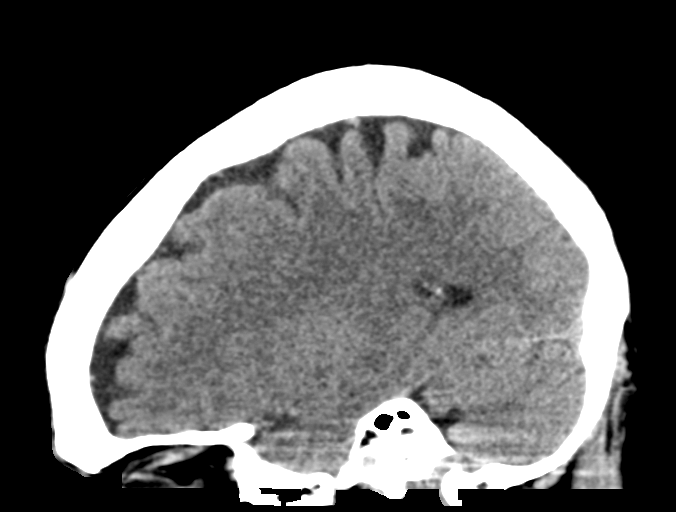

[15 of 47 positions shown; findings below may reference images not displayed]

FINDINGS: Brain: No evidence of acute infarction, hemorrhage, hydrocephalus,
extra-axial collection or mass lesion/mass effect.

Vascular: No hyperdense vessel. Scattered calcification at the
carotid siphon.

Skull: Normal. Negative for fracture or focal lesion.

Sinuses/Orbits: No acute finding.

Other: None
IMPRESSION: Negative non contrasted CT appearance of the brain.

## 2019-11-18 ENCOUNTER — Encounter: Payer: Self-pay | Admitting: Emergency Medicine

## 2019-11-18 ENCOUNTER — Emergency Department
Admission: EM | Admit: 2019-11-18 | Discharge: 2019-11-18 | Disposition: A | Discharge status: Home / Self Care | Payer: Worker's Compensation | Attending: Emergency Medicine | Admitting: Emergency Medicine

## 2019-11-18 ENCOUNTER — Emergency Department: Payer: Worker's Compensation

## 2019-11-18 ENCOUNTER — Other Ambulatory Visit: Payer: Self-pay

## 2019-11-18 DIAGNOSIS — E119 Type 2 diabetes mellitus without complications: Secondary | ICD-10-CM | POA: Insufficient documentation

## 2019-11-18 DIAGNOSIS — Z87891 Personal history of nicotine dependence: Secondary | ICD-10-CM | POA: Insufficient documentation

## 2019-11-18 DIAGNOSIS — S59902A Unspecified injury of left elbow, initial encounter: Secondary | ICD-10-CM | POA: Insufficient documentation

## 2019-11-18 DIAGNOSIS — I10 Essential (primary) hypertension: Secondary | ICD-10-CM | POA: Insufficient documentation

## 2019-11-18 DIAGNOSIS — Y9289 Other specified places as the place of occurrence of the external cause: Secondary | ICD-10-CM | POA: Insufficient documentation

## 2019-11-18 DIAGNOSIS — Y9389 Activity, other specified: Secondary | ICD-10-CM | POA: Insufficient documentation

## 2019-11-18 DIAGNOSIS — S0990XA Unspecified injury of head, initial encounter: Secondary | ICD-10-CM | POA: Insufficient documentation

## 2019-11-18 MED ORDER — IBUPROFEN 400 MG PO TABS: 400.0000 mg | ORAL_TABLET | Freq: Four times a day (QID) | ORAL | 0 refills | Status: AC | PRN

## 2019-11-18 NOTE — ED Triage Notes (Signed)
See first RN Note: Pt only c/o R elbow pain and R knee pain. Pt denies LOC, states did hit his head but was wearing hard hat. Pt A&O x4 in triage.
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
## 2021-07-05 ENCOUNTER — Emergency Department
Admission: EM | Admit: 2021-07-05 | Discharge: 2021-07-05 | Disposition: A | Discharge status: Home / Self Care | Payer: Self-pay | Attending: Emergency Medicine | Admitting: Emergency Medicine

## 2021-07-05 ENCOUNTER — Emergency Department: Payer: Self-pay

## 2021-07-05 ENCOUNTER — Encounter: Payer: Self-pay | Admitting: Radiology

## 2021-07-05 ENCOUNTER — Other Ambulatory Visit: Payer: Self-pay

## 2021-07-05 DIAGNOSIS — J189 Pneumonia, unspecified organism: Secondary | ICD-10-CM | POA: Insufficient documentation

## 2021-07-05 DIAGNOSIS — I10 Essential (primary) hypertension: Secondary | ICD-10-CM | POA: Insufficient documentation

## 2021-07-05 DIAGNOSIS — Z8701 Personal history of pneumonia (recurrent): Secondary | ICD-10-CM | POA: Insufficient documentation

## 2021-07-05 DIAGNOSIS — E119 Type 2 diabetes mellitus without complications: Secondary | ICD-10-CM | POA: Insufficient documentation

## 2021-07-05 LAB — CBC WITH DIFFERENTIAL/PLATELET
Abs Immature Granulocytes: 0.04 10*3/uL (ref 0.00–0.07)
Basophils Absolute: 0 10*3/uL (ref 0.0–0.1)
Basophils Relative: 0 %
Eosinophils Absolute: 0 10*3/uL (ref 0.0–0.5)
Eosinophils Relative: 0 %
HCT: 39 % (ref 39.0–52.0)
Hemoglobin: 12.7 g/dL — ABNORMAL LOW (ref 13.0–17.0)
Immature Granulocytes: 0 %
Lymphocytes Relative: 4 %
Lymphs Abs: 0.5 10*3/uL — ABNORMAL LOW (ref 0.7–4.0)
MCH: 28.9 pg (ref 26.0–34.0)
MCHC: 32.6 g/dL (ref 30.0–36.0)
MCV: 88.6 fL (ref 80.0–100.0)
Monocytes Absolute: 0.6 10*3/uL (ref 0.1–1.0)
Monocytes Relative: 5 %
Neutro Abs: 10.5 10*3/uL — ABNORMAL HIGH (ref 1.7–7.7)
Neutrophils Relative %: 91 %
Platelets: 478 10*3/uL — ABNORMAL HIGH (ref 150–400)
RBC: 4.4 MIL/uL (ref 4.22–5.81)
RDW: 12.6 % (ref 11.5–15.5)
WBC: 11.7 10*3/uL — ABNORMAL HIGH (ref 4.0–10.5)
nRBC: 0 % (ref 0.0–0.2)

## 2021-07-05 LAB — TROPONIN I (HIGH SENSITIVITY)
Troponin I (High Sensitivity): 5 ng/L (ref <18)
Troponin I (High Sensitivity): 5 ng/L (ref <18)

## 2021-07-05 LAB — COMPREHENSIVE METABOLIC PANEL
ALT: 17 U/L (ref 0–44)
AST: 18 U/L (ref 15–41)
Albumin: 3.5 g/dL (ref 3.5–5.0)
Alkaline Phosphatase: 93 U/L (ref 38–126)
Anion gap: 11 (ref 5–15)
BUN: 25 mg/dL — ABNORMAL HIGH (ref 6–20)
CO2: 27 mmol/L (ref 22–32)
Calcium: 9.3 mg/dL (ref 8.9–10.3)
Chloride: 96 mmol/L — ABNORMAL LOW (ref 98–111)
Creatinine, Ser: 0.68 mg/dL (ref 0.61–1.24)
GFR, Estimated: >60 mL/min (ref >60)
Glucose, Bld: 298 mg/dL — ABNORMAL HIGH (ref 70–99)
Potassium: 4.3 mmol/L (ref 3.5–5.1)
Sodium: 134 mmol/L — ABNORMAL LOW (ref 135–145)
Total Bilirubin: 0.5 mg/dL (ref 0.3–1.2)
Total Protein: 8.4 g/dL — ABNORMAL HIGH (ref 6.5–8.1)

## 2021-07-05 MED ORDER — AMOXICILLIN-POT CLAVULANATE 875-125 MG PO TABS: 1.0000 | ORAL_TABLET | Freq: Two times a day (BID) | ORAL | 0 refills | Status: AC

## 2021-07-05 MED ORDER — IOHEXOL 350 MG/ML SOLN
75.0000 mL | Freq: Once | INTRAVENOUS | Status: AC | PRN
  Administered 2021-07-05: 75 mL via INTRAVENOUS

## 2021-07-05 MED ORDER — DOXYCYCLINE MONOHYDRATE 100 MG PO TABS
100.0000 mg | ORAL_TABLET | Freq: Two times a day (BID) | ORAL | 0 refills | Status: AC
Start: 2021-07-05 — End: 2021-07-10

## 2021-07-05 MED ORDER — OXYCODONE-ACETAMINOPHEN 5-325 MG PO TABS
1.0000 | ORAL_TABLET | Freq: Once | ORAL | Status: AC
  Administered 2021-07-05: 1 via ORAL
  Filled 2021-07-05: qty 1

## 2021-07-05 NOTE — ED Provider Notes (Signed)
? ?[facility] ?Provider Note ? ? ? Event Date/Time  ? First MD Initiated Contact with Patient 07/05/21 1506   ?  (approximate) ? ? ?History  ? ?Shortness of Breath ? ? ?HPI ? ?Emiliano Hollett is a 61 y.o. male with past medical history of diabetes hypertension depression who presents with chest pain.  Patient notes that he was diagnosed with pneumonia about 2 weeks ago after having an x-ray done urgent care.  At that time he had productive cough shortness of breath chills.  He received Zithromax which he completed and ultimately felt better.  Over the last day and a half he has had sharp pleuritic right-sided chest pain.  Pain is located in right anterior chest is constant worse with deep breathing and movement.  He also feels dyspneic.  Denies ongoing cough no hemoptysis.  The patient denies hx of prior DVT/PE, unilateral leg pain/swelling, hormone use, recent surgery, hx of cancer, prolonged immobilization, or hemoptysis.  ? ?  ? ?Past Medical History:  ?Diagnosis Date  ? Arthritis   ? gout  ? Depression   ? Diabetes mellitus without complication ([place])   ? Hypertension   ? ? ?Patient Active Problem List  ? Diagnosis Date Noted  ? Altered mental status 11/08/2017  ? At risk for abuse of opiates 11/08/2017  ? Chronic pain syndrome 11/08/2017  ? Syncope 09/19/2017  ? Decreased responsiveness 09/18/2017  ? Diabetes mellitus without complication ([place]) 0000000  ? Hypertension 05/10/2015  ? Depression 05/10/2015  ? Fracture of multiple ribs 05/01/2015  ? Angioedema 09/14/2014  ? ? ? ?Physical Exam  ?Triage Vital Signs: ?ED Triage Vitals  ?Enc Vitals Group  ?   BP 07/05/21 1405 122/85  ?   Pulse Rate 07/05/21 1405 95  ?   Resp 07/05/21 1405 18  ?   Temp 07/05/21 1405 98.4 ?F (36.9 ?C)  ?   Temp Source 07/05/21 1405 Oral  ?   SpO2 07/05/21 1405 98 %  ?   Weight 07/05/21 1406 145 lb (65.8 kg)  ?   Height 07/05/21 1406 5\' 11"  (1.803 m)  ?   Head Circumference --   ?   Peak Flow --   ?   Pain Score  07/05/21 1406 8  ?   Pain Loc --   ?   Pain Edu? --   ?   Excl. in [place]? --   ? ? ?Most recent vital signs: ?Vitals:  ? 07/05/21 1515 07/05/21 1600  ?BP: (!) 150/92 (!) 146/77  ?Pulse: 70 (!) 59  ?Resp: 20 15  ?Temp:    ?SpO2: 99% 97%  ? ? ? ?General: Awake, no distress.  ?CV:  Good peripheral perfusion. No peripheral edema ?Resp:  Patient is splinting, difficult to auscultate breath sounds ?Abd:  No distention.  Soft nontender ?Neuro:             Awake, Alert, Oriented x 3  ?Other:   ? ? ?ED Results / Procedures / Treatments  ?Labs ?(all labs ordered are listed, but only abnormal results are displayed) ?Labs Reviewed  ?COMPREHENSIVE METABOLIC PANEL - Abnormal; Notable for the following components:  ?    Result Value  ? Sodium 134 (*)   ? Chloride 96 (*)   ? Glucose, Bld 298 (*)   ? BUN 25 (*)   ? Total Protein 8.4 (*)   ? All other components within normal limits  ?CBC WITH DIFFERENTIAL/PLATELET - Abnormal; Notable for the following components:  ?  WBC 11.7 (*)   ? Hemoglobin 12.7 (*)   ? Platelets 478 (*)   ? Neutro Abs 10.5 (*)   ? Lymphs Abs 0.5 (*)   ? All other components within normal limits  ?TROPONIN I (HIGH SENSITIVITY)  ?TROPONIN I (HIGH SENSITIVITY)  ? ? ? ?EKG ? ?Interpreted by myself shows normal sinus rhythm normal axis right bundle branch block without acute ischemic changes ? ? ?RADIOLOGY ?Interpreted by myself shows possible right-sided infiltrate versus atelectasis ? ? ?PROCEDURES: ? ?Critical Care performed: No ? ?.1-3 Lead EKG Interpretation ?Performed by: Rada Hay, MD ?Authorized by: Rada Hay, MD  ? ?  Interpretation: normal   ?  ECG rate assessment: normal   ?  Ectopy: none   ?  Conduction: normal   ? ?The patient is on the cardiac monitor to evaluate for evidence of arrhythmia and/or significant heart rate changes. ? ? ?MEDICATIONS ORDERED IN ED: ?Medications  ?oxyCODONE-acetaminophen (PERCOCET/ROXICET) 5-325 MG per tablet 1 tablet (1 tablet Oral Given 07/05/21 1544)  ?iohexol  (OMNIPAQUE) 350 MG/ML injection 75 mL (75 mLs Intravenous Contrast Given 07/05/21 1627)  ? ? ? ?IMPRESSION / MDM / ASSESSMENT AND PLAN / ED COURSE  ?I reviewed the triage vital signs and the nursing notes. ?             ?               ? ?Differential diagnosis includes, but is not limited to, pneumonia, pulmonary infarct from pulmonary embolism, atelectasis, pulmonary abscess ? ?Patient is a 60 year old male who presents with pleuritic chest pain and dyspnea.  He was recently treated for commune acquired pneumonia as an outpatient after diagnosis by urgent care apparently he did have an x-ray and was treated with azithromycin only.  Initially felt better however over the last day and a half he has had sharp pleuritic pain in the right anterior chest and feels dyspneic.  He is no longer having cough no fevers or other infectious symptoms.  He has no history of PE.  Sats are okay he is mildly hypertensive but otherwise vitals are within normal limits.  Patient appears somewhat uncomfortable and appears to be splinting he has breath sounds bilaterally but difficult to auscultate due to poor inspiratory effort.  Chest x-ray shows possible right-sided infiltrate versus atelectasis.  I am also concerned for potential pulmonary infarct or atelectasis related to PE given his symptoms.  Will further assess this lung finding with CTA which will also evaluate for PE. ? ?CTA is negative for PE. There is a rounded area with air bronchograms that is compatible with pneumonia however patient will need close follow-up to ensure that this is resolving as there may be underlying malignancy.  Discussed this with the patient and on further history he does note that he has had a significant unintentional weight loss over the last several months.  No history of smoking.  This certainly concerns me that there is a high chance that this is a postobstructive pneumonia with potential underlying mass I conveyed this to him.  Will refer to  pulmonology.  His pain is well controlled he is not hypoxic do not feel that he needs admission but I would want him to have close f/u.  Since patient initially improved with his first treatment of azithromycin do not feel that he is necessarily failed outpatient antibiotics.  Will give Augmentin and Doxy for 5 days and refer to pulmonology. ?  ? ? ?FINAL CLINICAL  IMPRESSION(S) / ED DIAGNOSES  ? ?Final diagnoses:  ?Recurrent pneumonia  ? ? ? ?Rx / DC Orders  ? ?ED Discharge Orders   ? ? None  ? ?  ? ? ? ?Note:  This document was prepared using Dragon voice recognition software and may include unintentional dictation errors. ?  ?Rada Hay, MD ?07/05/21 1712 ? ?

## 2021-07-05 NOTE — Discharge Instructions (Signed)
Your CAT scan today shows that you have pneumonia but it is possible that this is due to an underlying lung mass.  We will treat you with antibiotics for 5 days.  However I would like you to follow-up with pulmonology closely as you will likely need further evaluation for an underlying lung cancer as the cause of the recurrent pneumonia.  Please return to the emergency department if you have pain is not controlled, you are coughing up blood or your shortness of breath worsens. ?

## 2021-07-05 NOTE — ED Provider Triage Note (Signed)
Emergency Medicine Provider Triage Evaluation Note ? ?Dennis Pineda , a 61 y.o. male  was evaluated in triage.  Pt complains of chest pain, shortness of breath. ? ?Review of Systems  ?Positive: Chest pain, shortness of breath ?Negative: Fever, chills ? ?Physical Exam  ?There were no vitals taken for this visit. ?Gen:   Awake, no distress   ?Resp:  Increased effort  ?MSK:   Moves extremities without difficulty  ?Other:   ? ?Medical Decision Making  ?Medically screening exam initiated at 2:05 PM.  Appropriate orders placed.  Dennis Pineda was informed that the remainder of the evaluation will be completed by another provider, this initial triage assessment does not replace that evaluation, and the importance of remaining in the ED until their evaluation is complete. ? ? ?  ?Faythe Ghee, PA-C ?07/05/21 1406 ? ?

## 2021-07-05 NOTE — ED Triage Notes (Signed)
Pt here with SOB that started a few days ago. Pt states he was recently dx with PNA. Pt states he finished his course of abx. PT having significant left sided pain. Pt denies N/V/D. ?

## 2021-09-16 ENCOUNTER — Institutional Professional Consult (permissible substitution): Payer: Self-pay | Admitting: Pulmonary Disease

## 2021-09-16 NOTE — Progress Notes (Deleted)
Synopsis: Referred in *** for *** by Corrington, Kip A, MD  Subjective:   PATIENT ID: Dennis Pineda GENDER: male DOB: Dec 10, 1960, MRN: [id number]  No chief complaint on file.   HPI  Dennis Pineda is a 61 y.o. M who was referred to our office after a possible lung mass was noted during imaging for recurrent pneumonia.   He presented to the ED in May 2023 with dyspnea. Two weeks prior he was seen at an urgent care and diagnosed with pneumonia. He completed a two week course of Zithromax with some improvement in his symptoms however ultimately began to feel poorly again prompting ED visit. At that time he complained of dyspnea and pleuritic chest pain on the R with cough, no hemoptysis. A chest x-ray was obtained and was concerning for R middle lobe atelectasis versus pneumonia. CTA chest was obtained and showed a rounded right middle lobe consolidation with associated air bronchograms, compatible with pneumonia; patchy right lower lobe consolidation, likely a combination of airspace disease and atelectasis; trace right pleural effusion; and dilated main pulmonary artery, which may reflect pulmonary arterial hypertension.  Past Medical History:  Diagnosis Date   Arthritis    gout   Depression    Diabetes mellitus without complication (HCC)    Hypertension      Family History  Problem Relation Age of Onset   Diabetes Mother    Cancer Father        Liver   Diabetes Father    Diabetes Sister    Diabetes Maternal Grandmother      Past Surgical History:  Procedure Laterality Date   APPENDECTOMY     KNEE SURGERY     ROTATOR CUFF REPAIR     SHOULDER SURGERY      Social History   Socioeconomic History   Marital status: Married    Spouse name: Not on file   Number of children: Not on file   Years of education: Not on file   Highest education level: Not on file  Occupational History   Not on file  Tobacco Use   Smoking status: Former    Types: Cigarettes   Smokeless  tobacco: Never  Vaping Use   Vaping Use: Some days  Substance and Sexual Activity   Alcohol use: No    Alcohol/week: 0.0 standard drinks of alcohol   Drug use: No   Sexual activity: Yes  Other Topics Concern   Not on file  Social History Narrative   Lives with wife, works, drives.   Social Determinants of Health   Financial Resource Strain: Low Risk  (09/19/2017)   Overall Financial Resource Strain (CARDIA)    Difficulty of Paying Living Expenses: Not hard at all  Food Insecurity: No Food Insecurity (09/19/2017)   Hunger Vital Sign    Worried About Running Out of Food in the Last Year: Never true    Ran Out of Food in the Last Year: Never true  Transportation Needs: Unmet Transportation Needs (09/19/2017)   PRAPARE - Administrator, Civil Service (Medical): Yes    Lack of Transportation (Non-Medical): Yes  Physical Activity: Sufficiently Active (09/19/2017)   Exercise Vital Sign    Days of Exercise per Week: 4 days    Minutes of Exercise per Session: 40 min  Stress: Stress Concern Present (09/19/2017)   [employer] of Occupational Health - Occupational Stress Questionnaire    Feeling of Stress : To some extent  Social Connections: Somewhat Isolated (09/19/2017)  Social Advertising account executive [NHANES]    Frequency of Communication with Friends and Family: Twice a week    Frequency of Social Gatherings with Friends and Family: Twice a week    Attends Religious Services: Never    Database administrator or Organizations: No    Attends Banker Meetings: Never    Marital Status: Married  Catering manager Violence: Not At Risk (09/19/2017)   Humiliation, Afraid, Rape, and Kick questionnaire    Fear of Current or Ex-Partner: No    Emotionally Abused: No    Physically Abused: No    Sexually Abused: No     No Known Allergies   Outpatient Medications Prior to Visit  Medication Sig Dispense Refill   allopurinol (ZYLOPRIM) 100 MG tablet Take 1  tablet (100 mg total) by mouth daily. 30 tablet 2   Buprenorphine HCl-Naloxone HCl 8-2 MG FILM Place 1 Film under the tongue in the morning and at bedtime. 30 day supply on 06/04/2019     buprenorphine-naloxone (SUBOXONE) 8-2 mg SUBL SL tablet Place 1 tablet under the tongue daily.     cyclobenzaprine (FLEXERIL) 10 MG tablet Take 10 mg by mouth 3 (three) times daily as needed for muscle spasms.     escitalopram (LEXAPRO) 20 MG tablet Take 1 tablet (20 mg total) by mouth daily for 30 days. 30 tablet 0   gabapentin (NEURONTIN) 300 MG capsule Take 600-1,200 mg by mouth See admin instructions. 600mg  with meals 1200mg  at bedtime     ibuprofen (ADVIL) 400 MG tablet Take 1 tablet (400 mg total) by mouth every 6 (six) hours as needed. 30 tablet 0   methocarbamol (ROBAXIN) 500 MG tablet Take 1 tablet (500 mg total) by mouth at bedtime. 30 tablet 0   Naphazoline HCl (CLEAR EYES OP) Place 2 drops into both eyes daily as needed (for red eyes).     No facility-administered medications prior to visit.    ROS   Objective:  Physical Exam   There were no vitals filed for this visit.   on *** LPM *** RA BMI Readings from Last 3 Encounters:  07/05/21 20.22 kg/m  11/18/19 22.32 kg/m  06/18/19 24.29 kg/m   Wt Readings from Last 3 Encounters:  07/05/21 145 lb (65.8 kg)  11/18/19 160 lb (72.6 kg)  06/18/19 174 lb 2.6 oz (79 kg)     CBC    Component Value Date/Time   WBC 11.7 (H) 07/05/2021 1410   RBC 4.40 07/05/2021 1410   HGB 12.7 (L) 07/05/2021 1410   HCT 39.0 07/05/2021 1410   PLT 478 (H) 07/05/2021 1410   MCV 88.6 07/05/2021 1410   MCH 28.9 07/05/2021 1410   MCHC 32.6 07/05/2021 1410   RDW 12.6 07/05/2021 1410   LYMPHSABS 0.5 (L) 07/05/2021 1410   MONOABS 0.6 07/05/2021 1410   EOSABS 0.0 07/05/2021 1410   BASOSABS 0.0 07/05/2021 1410    ***  Chest Imaging: 07/05/2021 Chest x-ray: R middle lobe atelectasis versus pneumonia.  07/05/2021: CTA chest: Rounded right middle lobe  consolidation with associated air bronchograms, compatible with pneumonia; patchy right lower lobe consolidation, likely a combination of airspace disease and atelectasis; trace right pleural effusion; and dilated main pulmonary artery, which may reflect pulmonary arterial hypertension  Pulmonary Functions Testing Results:     No data to display          FeNO: ***  Pathology: ***  Echocardiogram: 08/2017 Left ventricle:  The cavity size was normal. Systolic function  was vigorous. The estimated ejection fraction was 70%. Wall motion was normal; there were no regional wall motion abnormalities. Doppler parameters are consistent with abnormal left ventricular relaxation  (grade 1 diastolic dysfunction).   Aortic valve:There was no significant regurgitation.   Mitral valve: There was trivial regurgitation.   Left atrium: The atrium was normal in size.   Pulmonary veins: The Doppler velocity and flow profile were normal.   Right ventricle:  The cavity size was normal. Wall thickness was normal. Systolic function was normal.   Pulmonic valve: There was trivial regurgitation.   Tricuspid valve: There was trivial regurgitation.      Assessment & Plan:   No diagnosis found.  Discussion: ***   Current Outpatient Medications:    allopurinol (ZYLOPRIM) 100 MG tablet, Take 1 tablet (100 mg total) by mouth daily., Disp: 30 tablet, Rfl: 2   Buprenorphine HCl-Naloxone HCl 8-2 MG FILM, Place 1 Film under the tongue in the morning and at bedtime. 30 day supply on 06/04/2019, Disp: , Rfl:    buprenorphine-naloxone (SUBOXONE) 8-2 mg SUBL SL tablet, Place 1 tablet under the tongue daily., Disp: , Rfl:    cyclobenzaprine (FLEXERIL) 10 MG tablet, Take 10 mg by mouth 3 (three) times daily as needed for muscle spasms., Disp: , Rfl:    escitalopram (LEXAPRO) 20 MG tablet, Take 1 tablet (20 mg total) by mouth daily for 30 days., Disp: 30 tablet, Rfl: 0   gabapentin (NEURONTIN) 300 MG capsule,  Take 600-1,200 mg by mouth See admin instructions. 600mg  with meals 1200mg  at bedtime, Disp: , Rfl:    ibuprofen (ADVIL) 400 MG tablet, Take 1 tablet (400 mg total) by mouth every 6 (six) hours as needed., Disp: 30 tablet, Rfl: 0   methocarbamol (ROBAXIN) 500 MG tablet, Take 1 tablet (500 mg total) by mouth at bedtime., Disp: 30 tablet, Rfl: 0   Naphazoline HCl (CLEAR EYES OP), Place 2 drops into both eyes daily as needed (for red eyes)., Disp: , Rfl:   I spent *** minutes dedicated to the care of this patient on the date of this encounter to include pre-visit review of records, face-to-face time with the patient discussing conditions above, post visit ordering of testing, clinical documentation with the electronic health record, making appropriate referrals as documented, and communicating necessary findings to members of the patients care team.   , DO [facility] 09/16/2021 2:56 PM

## 2021-10-07 ENCOUNTER — Encounter: Payer: Self-pay | Admitting: Pulmonary Disease

## 2021-10-07 ENCOUNTER — Ambulatory Visit (INDEPENDENT_AMBULATORY_CARE_PROVIDER_SITE_OTHER): Payer: Self-pay | Admitting: Pulmonary Disease

## 2021-10-07 VITALS — BP 136/74 | HR 84 | Temp 98.1°F | Ht 71.0 in | Wt 148.4 lb

## 2021-10-07 DIAGNOSIS — R918 Other nonspecific abnormal finding of lung field: Secondary | ICD-10-CM

## 2021-10-07 DIAGNOSIS — J189 Pneumonia, unspecified organism: Secondary | ICD-10-CM

## 2021-10-07 NOTE — Progress Notes (Signed)
Synopsis: Referred in August 2023 for lung mass by Corrington, Meredith Mody, MD  Subjective:   PATIENT ID: Dennis Pineda GENDER: male DOB: 20-Aug-1960, MRN: [id number]  Chief Complaint  Patient presents with   Consult    Review CT scan     This is a 61 year old gentleman, past medical history of diabetes, hypertension.  Patient is a former smoker.Patient had CT scan of the chest on 07/05/2021.  Patient had a dense consolidation in the medial segment of the right middle lobe measuring 4 x 3.6 x 3 cm in size.  He had associated air bronchograms most consistent with a pneumonia.  Patient need follow-up repeat imaging to ensure resolution of area to make sure that there is no underlying mass.  Patient here today for follow-up abnormal CT imaging and consideration for repeat CT. at the time when the CT scan was done initially he had cough shortness of breath and pleuritic chest pain.  Consistent with infectious symptoms.  He was also treated with antibiotics and have improved.    Past Medical History:  Diagnosis Date   Arthritis    gout   Depression    Diabetes mellitus without complication (HCC)    Hypertension      Family History  Problem Relation Age of Onset   Diabetes Mother    Cancer Father        Liver   Diabetes Father    Diabetes Sister    Diabetes Maternal Grandmother      Past Surgical History:  Procedure Laterality Date   APPENDECTOMY     KNEE SURGERY     ROTATOR CUFF REPAIR     SHOULDER SURGERY      Social History   Socioeconomic History   Marital status: Married    Spouse name: Not on file   Number of children: Not on file   Years of education: Not on file   Highest education level: Not on file  Occupational History   Not on file  Tobacco Use   Smoking status: Former    Types: Cigarettes   Smokeless tobacco: Never  Vaping Use   Vaping Use: Some days  Substance and Sexual Activity   Alcohol use: No    Alcohol/week: 0.0 standard drinks of alcohol   Drug  use: No   Sexual activity: Yes  Other Topics Concern   Not on file  Social History Narrative   Lives with wife, works, drives.   Social Determinants of Health   Financial Resource Strain: Low Risk  (09/19/2017)   Overall Financial Resource Strain (CARDIA)    Difficulty of Paying Living Expenses: Not hard at all  Food Insecurity: No Food Insecurity (09/19/2017)   Hunger Vital Sign    Worried About Running Out of Food in the Last Year: Never true    Ran Out of Food in the Last Year: Never true  Transportation Needs: Unmet Transportation Needs (09/19/2017)   PRAPARE - Administrator, Civil Service (Medical): Yes    Lack of Transportation (Non-Medical): Yes  Physical Activity: Sufficiently Active (09/19/2017)   Exercise Vital Sign    Days of Exercise per Week: 4 days    Minutes of Exercise per Session: 40 min  Stress: Stress Concern Present (09/19/2017)   [employer] of Occupational Health - Occupational Stress Questionnaire    Feeling of Stress : To some extent  Social Connections: Somewhat Isolated (09/19/2017)   Social Connection and Isolation Panel [NHANES]    Frequency of Communication  with Friends and Family: Twice a week    Frequency of Social Gatherings with Friends and Family: Twice a week    Attends Religious Services: Never    Database administrator or Organizations: No    Attends Banker Meetings: Never    Marital Status: Married  Catering manager Violence: Not At Risk (09/19/2017)   Humiliation, Afraid, Rape, and Kick questionnaire    Fear of Current or Ex-Partner: No    Emotionally Abused: No    Physically Abused: No    Sexually Abused: No     No Known Allergies   Outpatient Medications Prior to Visit  Medication Sig Dispense Refill   buprenorphine-naloxone (SUBOXONE) 8-2 mg SUBL SL tablet Place 1 tablet under the tongue daily.     allopurinol (ZYLOPRIM) 100 MG tablet Take 1 tablet (100 mg total) by mouth daily. 30 tablet 2    Buprenorphine HCl-Naloxone HCl 8-2 MG FILM Place 1 Film under the tongue in the morning and at bedtime. 30 day supply on 06/04/2019 (Patient not taking: Reported on 10/07/2021)     cyclobenzaprine (FLEXERIL) 10 MG tablet Take 10 mg by mouth 3 (three) times daily as needed for muscle spasms. (Patient not taking: Reported on 10/07/2021)     escitalopram (LEXAPRO) 20 MG tablet Take 1 tablet (20 mg total) by mouth daily for 30 days. 30 tablet 0   gabapentin (NEURONTIN) 300 MG capsule Take 600-1,200 mg by mouth See admin instructions. 600mg  with meals 1200mg  at bedtime (Patient not taking: Reported on 10/07/2021)     ibuprofen (ADVIL) 400 MG tablet Take 1 tablet (400 mg total) by mouth every 6 (six) hours as needed. (Patient not taking: Reported on 10/07/2021) 30 tablet 0   methocarbamol (ROBAXIN) 500 MG tablet Take 1 tablet (500 mg total) by mouth at bedtime. (Patient not taking: Reported on 10/07/2021) 30 tablet 0   Naphazoline HCl (CLEAR EYES OP) Place 2 drops into both eyes daily as needed (for red eyes). (Patient not taking: Reported on 10/07/2021)     No facility-administered medications prior to visit.    Review of Systems  Constitutional:  Negative for chills, fever, malaise/fatigue and weight loss.  HENT:  Negative for hearing loss, sore throat and tinnitus.   Eyes:  Negative for blurred vision and double vision.  Respiratory:  Negative for cough, hemoptysis, sputum production, shortness of breath, wheezing and stridor.   Cardiovascular:  Negative for chest pain, palpitations, orthopnea, leg swelling and PND.  Gastrointestinal:  Negative for abdominal pain, constipation, diarrhea, heartburn, nausea and vomiting.  Genitourinary:  Negative for dysuria, hematuria and urgency.  Musculoskeletal:  Negative for joint pain and myalgias.  Skin:  Negative for itching and rash.  Neurological:  Negative for dizziness, tingling, weakness and headaches.  Endo/Heme/Allergies:  Negative for environmental  allergies. Does not bruise/bleed easily.  Psychiatric/Behavioral:  Negative for depression. The patient is not nervous/anxious and does not have insomnia.   All other systems reviewed and are negative.    Objective:  Physical Exam Vitals reviewed.  Constitutional:      General: He is not in acute distress.    Appearance: He is well-developed.  HENT:     Head: Normocephalic and atraumatic.  Eyes:     General: No scleral icterus.    Conjunctiva/sclera: Conjunctivae normal.     Pupils: Pupils are equal, round, and reactive to light.  Neck:     Vascular: No JVD.     Trachea: No tracheal deviation.  Cardiovascular:  Rate and Rhythm: Normal rate and regular rhythm.     Heart sounds: Normal heart sounds. No murmur heard. Pulmonary:     Effort: Pulmonary effort is normal. No tachypnea, accessory muscle usage or respiratory distress.     Breath sounds: No stridor. No wheezing, rhonchi or rales.  Abdominal:     General: There is no distension.     Palpations: Abdomen is soft.     Tenderness: There is no abdominal tenderness.  Musculoskeletal:        General: No tenderness.     Cervical back: Neck supple.  Lymphadenopathy:     Cervical: No cervical adenopathy.  Skin:    General: Skin is warm and dry.     Capillary Refill: Capillary refill takes less than 2 seconds.     Findings: No rash.  Neurological:     Mental Status: He is alert and oriented to person, place, and time.  Psychiatric:        Behavior: Behavior normal.      Vitals:   10/07/21 1631  BP: 136/74  Pulse: 84  Temp: 98.1 F (36.7 C)  TempSrc: Oral  SpO2: 99%  Weight: 148 lb 6.4 oz (67.3 kg)  Height: 5\' 11"  (1.803 m)   99% on RA BMI Readings from Last 3 Encounters:  10/07/21 20.70 kg/m  07/05/21 20.22 kg/m  11/18/19 22.32 kg/m   Wt Readings from Last 3 Encounters:  10/07/21 148 lb 6.4 oz (67.3 kg)  07/05/21 145 lb (65.8 kg)  11/18/19 160 lb (72.6 kg)     CBC    Component Value Date/Time    WBC 11.7 (H) 07/05/2021 1410   RBC 4.40 07/05/2021 1410   HGB 12.7 (L) 07/05/2021 1410   HCT 39.0 07/05/2021 1410   PLT 478 (H) 07/05/2021 1410   MCV 88.6 07/05/2021 1410   MCH 28.9 07/05/2021 1410   MCHC 32.6 07/05/2021 1410   RDW 12.6 07/05/2021 1410   LYMPHSABS 0.5 (L) 07/05/2021 1410   MONOABS 0.6 07/05/2021 1410   EOSABS 0.0 07/05/2021 1410   BASOSABS 0.0 07/05/2021 1410     Chest Imaging: May 2023 CT chest: Right middle lobe infiltrate with air bronchograms most consistent with pneumonia. Also has small pleural effusion on that side. The patient's images have been independently reviewed by me.    Pulmonary Functions Testing Results:     No data to display          FeNO:   Pathology:   Echocardiogram:   Heart Catheterization:     Assessment & Plan:     ICD-10-CM   1. Pneumonia of right middle lobe due to infectious organism  J18.9 CT CHEST WO CONTRAST    2. Lung mass  R91.8 CT CHEST WO CONTRAST      Discussion:  This is a 61 year old gentleman, history of tobacco use, currently vaping.  Developed right middle lobe pneumonia with pleuritic chest plain.  Was treated with antibiotics he has improved.  CT imaging needed for follow-up to ensure no underlying mass.  Plan: Repeat CT chest. Patient has a history of a DWI greater than 4 years ago he is serving his sentence starting on 10/18/2021. Working to try to get his CT scan before he goes into jail on the 28th. Orders placed for CT scan. Hopefully we can get a follow-up visit with APP in person or virtual next week after CT chest. If the lesion is still present in the right side then we going to need to  consider a nuclear medicine PET scan or consideration for bronchoscopy as there could be underlying mass. But his story is most consistent with infectious etiology and I expect his CT to show improvement.  The patient also feels like he has improved since his last CT scan and no longer having the pleuritic  chest pains.   Current Outpatient Medications:    buprenorphine-naloxone (SUBOXONE) 8-2 mg SUBL SL tablet, Place 1 tablet under the tongue daily., Disp: , Rfl:    allopurinol (ZYLOPRIM) 100 MG tablet, Take 1 tablet (100 mg total) by mouth daily., Disp: 30 tablet, Rfl: 2   Buprenorphine HCl-Naloxone HCl 8-2 MG FILM, Place 1 Film under the tongue in the morning and at bedtime. 30 day supply on 06/04/2019 (Patient not taking: Reported on 10/07/2021), Disp: , Rfl:    cyclobenzaprine (FLEXERIL) 10 MG tablet, Take 10 mg by mouth 3 (three) times daily as needed for muscle spasms. (Patient not taking: Reported on 10/07/2021), Disp: , Rfl:    escitalopram (LEXAPRO) 20 MG tablet, Take 1 tablet (20 mg total) by mouth daily for 30 days., Disp: 30 tablet, Rfl: 0   gabapentin (NEURONTIN) 300 MG capsule, Take 600-1,200 mg by mouth See admin instructions. 600mg  with meals 1200mg  at bedtime (Patient not taking: Reported on 10/07/2021), Disp: , Rfl:    ibuprofen (ADVIL) 400 MG tablet, Take 1 tablet (400 mg total) by mouth every 6 (six) hours as needed. (Patient not taking: Reported on 10/07/2021), Disp: 30 tablet, Rfl: 0   methocarbamol (ROBAXIN) 500 MG tablet, Take 1 tablet (500 mg total) by mouth at bedtime. (Patient not taking: Reported on 10/07/2021), Disp: 30 tablet, Rfl: 0   Naphazoline HCl (CLEAR EYES OP), Place 2 drops into both eyes daily as needed (for red eyes). (Patient not taking: Reported on 10/07/2021), Disp: , Rfl:    10/09/2021, DO [facility] 10/07/2021 4:43 PM

## 2021-10-07 NOTE — Patient Instructions (Addendum)
Thank you for visiting Dr. Tonia Brooms at [facility]. Today we recommend the following:  Orders Placed This Encounter  Procedures   CT CHEST WO CONTRAST   Return in about 1 week (around 10/14/2021) for with APP.    Please do your part to reduce the spread of COVID-19.

## 2021-10-12 ENCOUNTER — Ambulatory Visit (HOSPITAL_COMMUNITY)
Admission: RE | Admit: 2021-10-12 | Discharge: 2021-10-12 | Disposition: A | Discharge status: Home / Self Care | Payer: Self-pay | Source: Ambulatory Visit | Attending: Pulmonary Disease | Admitting: Pulmonary Disease

## 2021-10-12 DIAGNOSIS — J189 Pneumonia, unspecified organism: Secondary | ICD-10-CM | POA: Insufficient documentation

## 2021-10-12 DIAGNOSIS — R918 Other nonspecific abnormal finding of lung field: Secondary | ICD-10-CM | POA: Insufficient documentation

## 2021-10-15 ENCOUNTER — Ambulatory Visit: Payer: Self-pay | Admitting: Nurse Practitioner

## 2021-10-15 ENCOUNTER — Telehealth: Payer: Self-pay | Admitting: General Practice

## 2021-10-15 NOTE — Progress Notes (Signed)
Please let patient know his ct results. Infiltrate is mostly resolved. He needs a repeat ct chest in 3 months  Thanks,  BLI  Josephine Igo, DO [facility] 10/15/2021 5:59 PM

## 2021-10-15 NOTE — Telephone Encounter (Signed)
Pt did not show for appt today.  Attempted to call to reschedule the appointment but no answer and no voicemail available.  Per Rhunette Croft, patient needs a follow-up Chest CT in 3 months and an appointment following that as well.  Maximum Reiland C 4:54 PM
